# Patient Record
Sex: Male | Born: 1965 | Race: Black or African American | Hispanic: No | Marital: Married | Smoking: Never smoker
Health system: Southern US, Community
[De-identification: ages and names within clinical notes are randomized; demographics above are authoritative.]

## PROBLEM LIST (undated history)

## (undated) DIAGNOSIS — N179 Acute kidney failure, unspecified: Secondary | ICD-10-CM

## (undated) DIAGNOSIS — N184 Chronic kidney disease, stage 4 (severe): Secondary | ICD-10-CM

## (undated) DIAGNOSIS — Q613 Polycystic kidney, unspecified: Secondary | ICD-10-CM

## (undated) HISTORY — PX: OTHER SURGICAL HISTORY: SHX169

---
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
## 1998-06-12 ENCOUNTER — Emergency Department (HOSPITAL_COMMUNITY): Admission: EM | Admit: 1998-06-12 | Discharge: 1998-06-12 | Payer: Self-pay | Admitting: Emergency Medicine

## 1998-07-08 ENCOUNTER — Ambulatory Visit (HOSPITAL_COMMUNITY): Admission: RE | Admit: 1998-07-08 | Discharge: 1998-07-08 | Payer: Self-pay | Admitting: Orthopedic Surgery

## 1998-08-16 ENCOUNTER — Encounter: Payer: Self-pay | Admitting: Orthopedic Surgery

## 1998-08-16 ENCOUNTER — Ambulatory Visit (HOSPITAL_COMMUNITY): Admission: RE | Admit: 1998-08-16 | Discharge: 1998-08-16 | Payer: Self-pay | Admitting: Orthopedic Surgery

## 1998-09-07 ENCOUNTER — Encounter: Admission: RE | Admit: 1998-09-07 | Discharge: 1998-09-26 | Payer: Self-pay | Admitting: Orthopedic Surgery

## 1999-06-30 ENCOUNTER — Emergency Department (HOSPITAL_COMMUNITY): Admission: EM | Admit: 1999-06-30 | Discharge: 1999-06-30 | Payer: Self-pay | Admitting: Emergency Medicine

## 2001-06-18 ENCOUNTER — Encounter: Payer: Self-pay | Admitting: Emergency Medicine

## 2001-06-18 ENCOUNTER — Emergency Department (HOSPITAL_COMMUNITY): Admission: EM | Admit: 2001-06-18 | Discharge: 2001-06-18 | Payer: Self-pay | Admitting: Emergency Medicine

## 2002-07-15 ENCOUNTER — Emergency Department (HOSPITAL_COMMUNITY): Admission: EM | Admit: 2002-07-15 | Discharge: 2002-07-16 | Payer: Self-pay | Admitting: Emergency Medicine

## 2002-07-15 ENCOUNTER — Encounter: Payer: Self-pay | Admitting: Emergency Medicine

## 2006-03-19 ENCOUNTER — Emergency Department (HOSPITAL_COMMUNITY): Admission: EM | Admit: 2006-03-19 | Discharge: 2006-03-19 | Payer: Self-pay | Admitting: Emergency Medicine

## 2008-04-09 ENCOUNTER — Emergency Department (HOSPITAL_COMMUNITY): Admission: EM | Admit: 2008-04-09 | Discharge: 2008-04-09 | Payer: Self-pay | Admitting: Emergency Medicine

## 2016-11-19 DIAGNOSIS — R55 Syncope and collapse: Secondary | ICD-10-CM | POA: Insufficient documentation

## 2016-11-24 DIAGNOSIS — Q613 Polycystic kidney, unspecified: Secondary | ICD-10-CM | POA: Insufficient documentation

## 2019-05-08 ENCOUNTER — Emergency Department (HOSPITAL_COMMUNITY): Payer: Medicaid Other

## 2019-05-08 ENCOUNTER — Inpatient Hospital Stay (HOSPITAL_COMMUNITY)
Admission: EM | Admit: 2019-05-08 | Discharge: 2019-05-15 | DRG: 674 | Disposition: A | Discharge status: Home / Self Care | Payer: Medicaid Other | Attending: Family Medicine | Admitting: Family Medicine

## 2019-05-08 ENCOUNTER — Other Ambulatory Visit: Payer: Self-pay

## 2019-05-08 ENCOUNTER — Inpatient Hospital Stay (HOSPITAL_COMMUNITY): Payer: Medicaid Other

## 2019-05-08 DIAGNOSIS — E8889 Other specified metabolic disorders: Secondary | ICD-10-CM | POA: Diagnosis present

## 2019-05-08 DIAGNOSIS — N185 Chronic kidney disease, stage 5: Secondary | ICD-10-CM

## 2019-05-08 DIAGNOSIS — N2581 Secondary hyperparathyroidism of renal origin: Secondary | ICD-10-CM | POA: Diagnosis present

## 2019-05-08 DIAGNOSIS — N184 Chronic kidney disease, stage 4 (severe): Secondary | ICD-10-CM

## 2019-05-08 DIAGNOSIS — I120 Hypertensive chronic kidney disease with stage 5 chronic kidney disease or end stage renal disease: Secondary | ICD-10-CM | POA: Diagnosis present

## 2019-05-08 DIAGNOSIS — D631 Anemia in chronic kidney disease: Secondary | ICD-10-CM | POA: Diagnosis present

## 2019-05-08 DIAGNOSIS — Z20828 Contact with and (suspected) exposure to other viral communicable diseases: Secondary | ICD-10-CM | POA: Diagnosis present

## 2019-05-08 DIAGNOSIS — Z95828 Presence of other vascular implants and grafts: Secondary | ICD-10-CM

## 2019-05-08 DIAGNOSIS — E860 Dehydration: Secondary | ICD-10-CM | POA: Diagnosis present

## 2019-05-08 DIAGNOSIS — Z79899 Other long term (current) drug therapy: Secondary | ICD-10-CM | POA: Diagnosis not present

## 2019-05-08 DIAGNOSIS — Z841 Family history of disorders of kidney and ureter: Secondary | ICD-10-CM | POA: Diagnosis not present

## 2019-05-08 DIAGNOSIS — Z992 Dependence on renal dialysis: Secondary | ICD-10-CM

## 2019-05-08 DIAGNOSIS — R1110 Vomiting, unspecified: Secondary | ICD-10-CM

## 2019-05-08 DIAGNOSIS — E876 Hypokalemia: Secondary | ICD-10-CM | POA: Diagnosis present

## 2019-05-08 DIAGNOSIS — N179 Acute kidney failure, unspecified: Secondary | ICD-10-CM | POA: Diagnosis present

## 2019-05-08 DIAGNOSIS — N186 End stage renal disease: Secondary | ICD-10-CM | POA: Diagnosis present

## 2019-05-08 DIAGNOSIS — D72829 Elevated white blood cell count, unspecified: Secondary | ICD-10-CM | POA: Diagnosis present

## 2019-05-08 DIAGNOSIS — N19 Unspecified kidney failure: Secondary | ICD-10-CM

## 2019-05-08 DIAGNOSIS — R112 Nausea with vomiting, unspecified: Secondary | ICD-10-CM | POA: Diagnosis present

## 2019-05-08 DIAGNOSIS — D638 Anemia in other chronic diseases classified elsewhere: Secondary | ICD-10-CM

## 2019-05-08 DIAGNOSIS — Z452 Encounter for adjustment and management of vascular access device: Secondary | ICD-10-CM

## 2019-05-08 HISTORY — DX: Chronic kidney disease, stage 4 (severe): N18.4

## 2019-05-08 HISTORY — DX: Acute kidney failure, unspecified: N17.9

## 2019-05-08 HISTORY — DX: Polycystic kidney, unspecified: Q61.3

## 2019-05-08 LAB — CBC
HCT: 28.6 % — ABNORMAL LOW (ref 39.0–52.0)
Hemoglobin: 9.5 g/dL — ABNORMAL LOW (ref 13.0–17.0)
MCH: 30.4 pg (ref 26.0–34.0)
MCHC: 33.2 g/dL (ref 30.0–36.0)
MCV: 91.4 fL (ref 80.0–100.0)
Platelets: 280 10*3/uL (ref 150–400)
RBC: 3.13 MIL/uL — ABNORMAL LOW (ref 4.22–5.81)
RDW: 14.7 % (ref 11.5–15.5)
WBC: 14.5 10*3/uL — ABNORMAL HIGH (ref 4.0–10.5)
nRBC: 0 % (ref 0.0–0.2)

## 2019-05-08 LAB — RENAL FUNCTION PANEL
Albumin: 3.1 g/dL — ABNORMAL LOW (ref 3.5–5.0)
Anion gap: 28 — ABNORMAL HIGH (ref 5–15)
BUN: 168 mg/dL — ABNORMAL HIGH (ref 6–20)
CO2: 9 mmol/L — ABNORMAL LOW (ref 22–32)
Calcium: 4.1 mg/dL — CL (ref 8.9–10.3)
Chloride: 101 mmol/L (ref 98–111)
Creatinine, Ser: 22.63 mg/dL — ABNORMAL HIGH (ref 0.61–1.24)
GFR calc Af Amer: 2 mL/min — ABNORMAL LOW (ref >60)
GFR calc non Af Amer: 2 mL/min — ABNORMAL LOW (ref >60)
Glucose, Bld: 195 mg/dL — ABNORMAL HIGH (ref 70–99)
Phosphorus: >30 mg/dL — ABNORMAL HIGH (ref 2.5–4.6)
Potassium: 2.9 mmol/L — ABNORMAL LOW (ref 3.5–5.1)
Sodium: 138 mmol/L (ref 135–145)

## 2019-05-08 LAB — MRSA PCR SCREENING: MRSA by PCR: NEGATIVE

## 2019-05-08 LAB — COMPREHENSIVE METABOLIC PANEL
ALT: 43 U/L (ref 0–44)
AST: 32 U/L (ref 15–41)
Albumin: 3.4 g/dL — ABNORMAL LOW (ref 3.5–5.0)
Alkaline Phosphatase: 56 U/L (ref 38–126)
Anion gap: 32 — ABNORMAL HIGH (ref 5–15)
BUN: 178 mg/dL — ABNORMAL HIGH (ref 6–20)
CO2: 9 mmol/L — ABNORMAL LOW (ref 22–32)
Calcium: 4.1 mg/dL — CL (ref 8.9–10.3)
Chloride: 98 mmol/L (ref 98–111)
Creatinine, Ser: 25.65 mg/dL — ABNORMAL HIGH (ref 0.61–1.24)
Glucose, Bld: 138 mg/dL — ABNORMAL HIGH (ref 70–99)
Potassium: 3.2 mmol/L — ABNORMAL LOW (ref 3.5–5.1)
Sodium: 139 mmol/L (ref 135–145)
Total Bilirubin: 1 mg/dL (ref 0.3–1.2)
Total Protein: 8.7 g/dL — ABNORMAL HIGH (ref 6.5–8.1)

## 2019-05-08 LAB — URINALYSIS, ROUTINE W REFLEX MICROSCOPIC
Bilirubin Urine: NEGATIVE
Glucose, UA: NEGATIVE mg/dL
Ketones, ur: NEGATIVE mg/dL
Nitrite: NEGATIVE
Protein, ur: 30 mg/dL — AB
Specific Gravity, Urine: 1.011 (ref 1.005–1.030)
pH: 7 (ref 5.0–8.0)

## 2019-05-08 LAB — IRON AND TIBC
Iron: 113 ug/dL (ref 45–182)
Saturation Ratios: 87 % — ABNORMAL HIGH (ref 17.9–39.5)
TIBC: 130 ug/dL — ABNORMAL LOW (ref 250–450)
UIBC: 17 ug/dL

## 2019-05-08 LAB — SARS CORONAVIRUS 2 BY RT PCR (HOSPITAL ORDER, PERFORMED IN ~~LOC~~ HOSPITAL LAB): SARS Coronavirus 2: NEGATIVE

## 2019-05-08 LAB — SARS CORONAVIRUS 2 (TAT 6-24 HRS): SARS Coronavirus 2: NEGATIVE

## 2019-05-08 LAB — FERRITIN: Ferritin: 711 ng/mL — ABNORMAL HIGH (ref 24–336)

## 2019-05-08 LAB — LIPASE, BLOOD: Lipase: 102 U/L — ABNORMAL HIGH (ref 11–51)

## 2019-05-08 LAB — PROTIME-INR
INR: 1.4 — ABNORMAL HIGH (ref 0.8–1.2)
Prothrombin Time: 17.2 seconds — ABNORMAL HIGH (ref 11.4–15.2)

## 2019-05-08 MED ORDER — ONDANSETRON HCL 4 MG/2ML IJ SOLN
4.0000 mg | Freq: Once | INTRAMUSCULAR | Status: AC
  Administered 2019-05-08: 4 mg via INTRAVENOUS
  Filled 2019-05-08: qty 2

## 2019-05-08 MED ORDER — SODIUM CHLORIDE 0.9 % IV SOLN: 100.0000 mL | INTRAVENOUS | Status: DC | PRN

## 2019-05-08 MED ORDER — HEPARIN SODIUM (PORCINE) 1000 UNIT/ML DIALYSIS
1000.0000 [IU] | INTRAMUSCULAR | Status: DC | PRN
  Filled 2019-05-08: qty 1

## 2019-05-08 MED ORDER — LORAZEPAM 2 MG/ML IJ SOLN
0.5000 mg | Freq: Once | INTRAMUSCULAR | Status: AC
  Administered 2019-05-08: 0.5 mg via INTRAVENOUS

## 2019-05-08 MED ORDER — SODIUM CHLORIDE 0.9 % IV BOLUS
1000.0000 mL | Freq: Once | INTRAVENOUS | Status: AC
  Administered 2019-05-08: 1000 mL via INTRAVENOUS

## 2019-05-08 MED ORDER — POLYETHYLENE GLYCOL 3350 17 G PO PACK: 17.0000 g | PACK | Freq: Every day | ORAL | Status: DC | PRN

## 2019-05-08 MED ORDER — HEPARIN SODIUM (PORCINE) 5000 UNIT/ML IJ SOLN
5000.0000 [IU] | Freq: Three times a day (TID) | INTRAMUSCULAR | Status: AC
  Administered 2019-05-09 – 2019-05-10 (×5): 5000 [IU] via SUBCUTANEOUS
  Filled 2019-05-08 (×5): qty 1

## 2019-05-08 MED ORDER — LIDOCAINE HCL (PF) 1 % IJ SOLN
5.0000 mL | INTRAMUSCULAR | Status: DC | PRN
  Filled 2019-05-08: qty 5

## 2019-05-08 MED ORDER — PROCHLORPERAZINE EDISYLATE 10 MG/2ML IJ SOLN
10.0000 mg | Freq: Once | INTRAMUSCULAR | Status: AC
  Administered 2019-05-08: 10 mg via INTRAVENOUS
  Filled 2019-05-08: qty 2

## 2019-05-08 MED ORDER — SODIUM BICARBONATE 8.4 % IV SOLN
INTRAVENOUS | Status: AC
  Administered 2019-05-08: 20:00:00 via INTRAVENOUS
  Filled 2019-05-08 (×2): qty 150

## 2019-05-08 MED ORDER — B-12 250 MCG PO TABS: ORAL_TABLET | Freq: Every day | ORAL | Status: DC

## 2019-05-08 MED ORDER — HEPARIN SODIUM (PORCINE) 1000 UNIT/ML DIALYSIS
1000.0000 [IU] | INTRAMUSCULAR | Status: DC | PRN
  Filled 2019-05-08 (×2): qty 6

## 2019-05-08 MED ORDER — SODIUM CHLORIDE 0.9 % IV SOLN
1.0000 g | Freq: Once | INTRAVENOUS | Status: AC
  Administered 2019-05-08: 1 g via INTRAVENOUS
  Filled 2019-05-08: qty 10

## 2019-05-08 MED ORDER — CALCIUM CARBONATE ANTACID 500 MG PO CHEW
2.0000 | CHEWABLE_TABLET | Freq: Three times a day (TID) | ORAL | Status: DC
  Administered 2019-05-08 – 2019-05-15 (×19): 400 mg via ORAL
  Filled 2019-05-08 (×19): qty 2

## 2019-05-08 MED ORDER — CALCIUM GLUCONATE-NACL 1-0.675 GM/50ML-% IV SOLN
1.0000 g | Freq: Once | INTRAVENOUS | Status: AC
  Administered 2019-05-08: 1000 mg via INTRAVENOUS
  Filled 2019-05-08: qty 50

## 2019-05-08 MED ORDER — ACETAMINOPHEN 650 MG RE SUPP: 650.0000 mg | Freq: Four times a day (QID) | RECTAL | Status: DC | PRN

## 2019-05-08 MED ORDER — ALTEPLASE 2 MG IJ SOLR
2.0000 mg | Freq: Once | INTRAMUSCULAR | Status: DC | PRN
  Filled 2019-05-08: qty 2

## 2019-05-08 MED ORDER — LIDOCAINE-PRILOCAINE 2.5-2.5 % EX CREA: 1.0000 "application " | TOPICAL_CREAM | CUTANEOUS | Status: DC | PRN

## 2019-05-08 MED ORDER — ONDANSETRON HCL 4 MG/2ML IJ SOLN
4.0000 mg | Freq: Once | INTRAMUSCULAR | Status: AC
  Administered 2019-05-08: 4 mg via INTRAVENOUS

## 2019-05-08 MED ORDER — HEPARIN SODIUM (PORCINE) 1000 UNIT/ML IJ SOLN
2.4000 mL | Freq: Once | INTRAMUSCULAR | Status: AC
  Administered 2019-05-09: 2400 [IU] via INTRAVENOUS

## 2019-05-08 MED ORDER — PROCHLORPERAZINE EDISYLATE 10 MG/2ML IJ SOLN
10.0000 mg | INTRAMUSCULAR | Status: DC | PRN
  Filled 2019-05-08: qty 2

## 2019-05-08 MED ORDER — ACETAMINOPHEN 325 MG PO TABS: 650.0000 mg | ORAL_TABLET | Freq: Four times a day (QID) | ORAL | Status: DC | PRN

## 2019-05-08 MED ORDER — POTASSIUM CHLORIDE 10 MEQ/50ML IV SOLN
10.0000 meq | INTRAVENOUS | Status: AC
  Administered 2019-05-08 – 2019-05-09 (×3): 10 meq via INTRAVENOUS
  Filled 2019-05-08 (×3): qty 50

## 2019-05-08 MED ORDER — SODIUM BICARBONATE 8.4 % IV SOLN
50.0000 meq | Freq: Once | INTRAVENOUS | Status: AC
  Administered 2019-05-08: 50 meq via INTRAVENOUS
  Filled 2019-05-08: qty 50

## 2019-05-08 MED ORDER — ONDANSETRON HCL 4 MG/2ML IJ SOLN
INTRAMUSCULAR | Status: AC
  Administered 2019-05-08: 4 mg via INTRAVENOUS
  Filled 2019-05-08: qty 2

## 2019-05-08 MED ORDER — CALCITRIOL 0.25 MCG PO CAPS
1.0000 ug | ORAL_CAPSULE | Freq: Two times a day (BID) | ORAL | Status: DC
  Administered 2019-05-08 – 2019-05-15 (×13): 1 ug via ORAL
  Filled 2019-05-08 (×6): qty 4
  Filled 2019-05-08: qty 2
  Filled 2019-05-08: qty 4
  Filled 2019-05-08: qty 2
  Filled 2019-05-08 (×3): qty 4

## 2019-05-08 MED ORDER — DOCUSATE SODIUM 100 MG PO CAPS
100.0000 mg | ORAL_CAPSULE | Freq: Two times a day (BID) | ORAL | Status: DC
  Administered 2019-05-08 – 2019-05-15 (×13): 100 mg via ORAL
  Filled 2019-05-08 (×13): qty 1

## 2019-05-08 MED ORDER — PENTAFLUOROPROP-TETRAFLUOROETH EX AERO: 1.0000 "application " | INHALATION_SPRAY | CUTANEOUS | Status: DC | PRN

## 2019-05-08 MED ORDER — CHLORHEXIDINE GLUCONATE CLOTH 2 % EX PADS
6.0000 | MEDICATED_PAD | Freq: Every day | CUTANEOUS | Status: DC
  Administered 2019-05-08: 6 via TOPICAL

## 2019-05-08 MED ORDER — LORAZEPAM 2 MG/ML IJ SOLN
INTRAMUSCULAR | Status: AC
  Administered 2019-05-08: 0.5 mg via INTRAVENOUS
  Filled 2019-05-08: qty 1

## 2019-05-08 NOTE — Progress Notes (Signed)
Calcium gluconte requested from pharmacy earlier. At this time pt not on the floor, transported to ICU for procedure. Will administer medication once pt returns to floor.

## 2019-05-08 NOTE — Progress Notes (Signed)
Patient arrived from 5W via bed to 2M09 for placement of a hemodialysis catheter. Dr. Francie Massing at bedside to do procedure. Time out was performed.  CVC was placed in Rt IJ under sterile procedure. Patient tolerated well. Hemodialysis to come to bedside for treatment. Will continue to monitor patient closely.

## 2019-05-08 NOTE — Consult Note (Signed)
Chief Complaint: Patient was seen in consultation today for acute renal failure  Referring Physician(s): Dr. Owens Shark  Supervising Physician: Corrie Mckusick  Patient Status: [facility] - ED  History of Present Illness: Bernard Jordan is a 53 y.o. male with no significant past medical history presents with 7 day history of nausea and vomiting. Suspected to have food poisoning now with dehydration and acute renal injury evidenced by BUN of 178 and SCr of 25.  IR consulted for tunneled dialysis catheter placement.   Patient assessed at bedside.  Adds little to history except to say that "my whole body is not working."  Patient alert and oriented.   No past medical history on file.  Allergies: Patient has no known allergies.  Medications: Prior to Admission medications   Medication Sig Start Date End Date Taking? Authorizing Provider  acetaminophen (TYLENOL) 500 MG tablet Take 1,000 mg by mouth every 6 (six) hours as needed for mild pain or moderate pain.   Yes [provider]  Cyanocobalamin (B-12 PO) Take 1 tablet by mouth daily.   Yes [provider]     No family history on file.  Social History   Socioeconomic History  . Marital status: Married    Spouse name: Not on file  . Number of children: Not on file  . Years of education: Not on file  . Highest education level: Not on file  Occupational History  . Not on file  Social Needs  . Financial resource strain: Not on file  . Food insecurity    Worry: Not on file    Inability: Not on file  . Transportation needs    Medical: Not on file    Non-medical: Not on file  Tobacco Use  . Smoking status: Not on file  Substance and Sexual Activity  . Alcohol use: Not on file  . Drug use: Not on file  . Sexual activity: Not on file  Lifestyle  . Physical activity    Days per week: Not on file    Minutes per session: Not on file  . Stress: Not on file  Relationships  . Social Herbalist on phone:  Not on file    Gets together: Not on file    Attends religious service: Not on file    Active member of club or organization: Not on file    Attends meetings of clubs or organizations: Not on file    Relationship status: Not on file  Other Topics Concern  . Not on file  Social History Narrative  . Not on file     Review of Systems: A 12 point ROS discussed and pertinent positives are indicated in the HPI above.  All other systems are negative.  Review of Systems  Constitutional: Negative for fatigue and fever.  Respiratory: Negative for cough and shortness of breath.   Cardiovascular: Negative for chest pain.  Gastrointestinal: Positive for abdominal pain, nausea and vomiting.  Genitourinary: Negative for dysuria.  Psychiatric/Behavioral: Negative for behavioral problems and confusion.    Vital Signs: BP 133/73   Pulse 85   Temp 97.6 F (36.4 C) (Axillary)   Resp 16   Ht 5\' 11"  (1.803 m)   Wt 220 lb (99.8 kg)   SpO2 96%   BMI 30.68 kg/m   Physical Exam Vitals signs and nursing note reviewed.  Constitutional:      General: He is not in acute distress.    Appearance: Normal appearance. He is  ill-appearing.  HENT:     Mouth/Throat:     Mouth: Mucous membranes are dry.  Cardiovascular:     Rate and Rhythm: Normal rate and regular rhythm.     Pulses: Normal pulses.  Pulmonary:     Effort: Pulmonary effort is normal.     Breath sounds: Normal breath sounds.  Skin:    General: Skin is warm and dry.  Neurological:     General: No focal deficit present.     Mental Status: He is alert and oriented to person, place, and time.  Psychiatric:        Mood and Affect: Mood normal.        Behavior: Behavior normal.        Thought Content: Thought content normal.        Judgment: Judgment normal.      MD Evaluation Airway: WNL Heart: WNL Abdomen: WNL Chest/ Lungs: WNL ASA  Classification: 3 Mallampati/Airway Score: One   Imaging: Dg Abd Acute 2+v W 1v Chest   Result Date: 05/08/2019 CLINICAL DATA:  Abdominal pain.  Intermittent nausea and vomiting. EXAM: DG ABDOMEN ACUTE W/ 1V CHEST COMPARISON:  No recent prior. FINDINGS: No acute cardiopulmonary disease. No bowel distention or free air thoracolumbar spine scoliosis. No acute bony abnormality. Pelvic calcifications consistent phleboliths. IMPRESSION: 1.  No acute cardiopulmonary disease. 2.  No acute intra-abdominal abnormality. Electronically Signed   By: Marcello Moores  Register   On: 05/08/2019 15:56    Labs:  CBC: Recent Labs    05/08/19 1320  WBC 14.5*  HGB 9.5*  HCT 28.6*  PLT 280    COAGS: No results for input(s): INR, APTT in the last 8760 hours.  BMP: Recent Labs    05/08/19 1320  NA 139  K 3.2*  CL 98  CO2 9*  GLUCOSE 138*  BUN 178*  CALCIUM 4.1*  CREATININE 25.65*  GFRNONAA NOT CALCULATED  GFRAA NOT CALCULATED    LIVER FUNCTION TESTS: Recent Labs    05/08/19 1320  BILITOT 1.0  AST 32  ALT 43  ALKPHOS 56  PROT 8.7*  ALBUMIN 3.4*    TUMOR MARKERS: No results for input(s): AFPTM, CEA, CA199, CHROMGRNA in the last 8760 hours.  Assessment and Plan: Acute renal failure Patient presented to ED with dehydration and renal failure.  He is in need of dialysis initiation.   IR consulted for tunneled catheter placement at the request of Dr. Owens Shark.  Dr. Earleen Newport has discussed with Dr. Owens Shark.  IR unable to accomodate for tunneled catheter placement today.  Plan made for tunneled dialysis catheter placement tomorrow.  If patient in need of urgent dialysis, consider bedside placement of temporary catheter placement by ED vs. CCM.  NPO p MN.  INR pending.   Thank you for this interesting consult.  I greatly enjoyed meeting Bernard Jordan and look forward to participating in their care.  A copy of this report was sent to the requesting provider on this date.  Electronically Signed: Docia Barrier, PA 05/08/2019, 4:53 PM   I spent a total of 40 Minutes    in face to  face in clinical consultation, greater than 50% of which was counseling/coordinating care for acute renal failure.

## 2019-05-08 NOTE — Progress Notes (Signed)
Bernard Jordan [id number] Admission Data: 05/08/2019 7:00 PM Attending Provider: Albertine Patricia, MD  [id number], No Pcp Per Consults/ Treatment Team: Treatment Team:  Corliss Parish, MD  Bernard Jordan is a 53 y.o. male patient admitted from ED awake, alert  & orientated  X 3,  Full Code, VSS - Blood pressure 124/69, pulse (!) 101, temperature 97.7 F (36.5 C), temperature source Oral, resp. rate 18, height 5\' 11"  (1.803 m), weight 99.8 kg, SpO2 98 %., no c/o shortness of breath, no c/o chest pain, no distress noted. Tele # 35 placed and pt is currently running:normal sinus rhythm.   IV site WDL:  antecubital left, condition patent and no redness with a transparent dsg that's clean dry and intact.  Allergies:  No Known Allergies   No past medical history on file.  History:  obtained from chart review. Tobacco/alcohol: denied none  Pt orientation to unit, room and routine. Information packet given to patient/family and safety video watched.  Admission INP armband ID verified with patient/family, and in place. SR up x 2, fall risk assessment complete with Patient and family verbalizing understanding of risks associated with falls. Pt verbalizes an understanding of how to use the call bell and to call for help before getting out of bed.  Skin, clean-dry- intact without evidence of bruising, or skin tears.   No evidence of skin break down noted on exam. no rashes, no ecchymoses, no petechiae, no nodules, no jaundice, no purpura, no wounds. Minor scarring on bottom.     Will cont to monitor and assist as needed.  Salley Slaughter, RN 05/08/2019 7:00 PM

## 2019-05-08 NOTE — ED Triage Notes (Signed)
7 day hx of weakness post eating crab legs and steak.  Raised rash appeared and has N/V with progressive weakness.  Cough also noted, no diarrhea or noted pain.

## 2019-05-08 NOTE — ED Notes (Signed)
On arrival pt having more spit into emesis basin than vomit.

## 2019-05-08 NOTE — Consult Note (Signed)
 [facility] Renal Consultation Note    Indication for Consultation:  Management of ESRD/hemodialysis; anemia, hypertension/volume and secondary hyperparathyroidism PCP:  HPI: Bernard Jordan is a 53 y.o. male is a 53 Y/O AA male who presented to ED this afternoon with complaints of nausea and vomiting. PMH of polycystic kidney disease.He has not been followed by nephrology. He was seen by Dr. Christella Hartigan 11/22/2016 for CKD stage 4 D/T PCKD and hypocalcemia at [facility] during an admission for syncope and was to follow up with her the following week. Unclear if he actually followed through with appointment. His mother is currently on HD. He has six siblings none of whom have kidney disease that he knows about now. 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   Patient presents with nausea and vomiting, says he ate seafood and steak about a week and has been nauseated since, unable to eat or drink without nausea. He also endorses itching, lethargy, dysgeusia and in general feeling awful. He says symptoms have waxed and waned in the past but now nothing seems to alleviate his symptoms. He is alert, oriented to person and place but needed prompting for time. He follows commands and is answering most questions appropriately. Last renal US done 06/19/2017 showed R Kidney 17.2 CM with irregular multi-cystic appearance. Echogenicity WNL. No mass or hydronephrosis. L Kidney 16.2 with multi-cystic appearance. No mass or hydronephrosis. Last Scr 6.7 BUN 52 EGFR 11 Ca 6.9 12/16/2016/   Upon arrival to ED, he was noted to have SCr 25.6 BUN 178, Co2 9 Ca 4.1 C Ca 4.58  WBC 14.5 HGB 9.5 HCT 28.6 PLT 280. Abdominal 2-view negative for acute changes, lipase 102. Discussed with patient the need to start hemodialysis and he agrees. Will make arrangements for placement of temporary HD catheter and start HD tonight. Patient verbalizes understanding-has  Mother who is currently on hemodialysis.     No family history on file. Social History:  has no history on file for tobacco, alcohol, and drug. No Known Allergies Prior to Admission medications   Medication Sig Start Date End Date Taking? Authorizing Provider  acetaminophen (TYLENOL) 500 MG tablet Take 1,000 mg by mouth every 6 (six) hours as needed for mild pain or moderate pain.   Yes [provider]  Cyanocobalamin (B-12 PO) Take 1 tablet by mouth daily.   Yes [provider]   Current Facility-Administered Medications  Medication Dose Route Frequency Provider Last Rate Last Dose  . calcium gluconate 1 g in sodium chloride 0.9 % 100 mL IVPB  1 g Intravenous Once Maudie Flakes, MD 110 mL/hr at 05/08/19 1614 1 g at 05/08/19 1614  . prochlorperazine (COMPAZINE) injection 10 mg  10 mg Intravenous Once Maudie Flakes, MD       Current Outpatient Medications  Medication Sig Dispense Refill  . acetaminophen (TYLENOL) 500 MG tablet Take 1,000 mg by mouth every 6 (six) hours as needed for mild pain or moderate pain.    . Cyanocobalamin (B-12 PO) Take 1 tablet by mouth daily.     Labs: Basic Metabolic Panel: Recent Labs  Lab 05/08/19 1320  NA 139  K 3.2*  CL 98  CO2 9*  GLUCOSE 138*  BUN 178*  CREATININE 25.65*  CALCIUM 4.1*   Liver Function Tests: Recent Labs  Lab 05/08/19 1320  AST 32  ALT 43  ALKPHOS 56  BILITOT 1.0  PROT 8.7*  ALBUMIN 3.4*   Recent Labs  Lab 05/08/19 1320  LIPASE 102*   No results for input(s): AMMONIA in the last 168 hours. CBC: Recent Labs  Lab 05/08/19 1320  WBC 14.5*  HGB 9.5*  HCT 28.6*  MCV 91.4  PLT 280   Cardiac Enzymes: No results for input(s): CKTOTAL, CKMB, CKMBINDEX, TROPONINI in the last 168 hours. CBG: No results for input(s): GLUCAP in the last 168 hours. Iron Studies: No results for input(s): IRON, TIBC, TRANSFERRIN, FERRITIN in the last 72 hours. Studies/Results: Dg Abd Acute 2+v W 1v  Chest  Result Date: 05/08/2019 CLINICAL DATA:  Abdominal pain.  Intermittent nausea and vomiting. EXAM: DG ABDOMEN ACUTE W/ 1V CHEST COMPARISON:  No recent prior. FINDINGS: No acute cardiopulmonary disease. No bowel distention or free air thoracolumbar spine scoliosis. No acute bony abnormality. Pelvic calcifications consistent phleboliths. IMPRESSION: 1.  No acute cardiopulmonary disease. 2.  No acute intra-abdominal abnormality. Electronically Signed   By: Marcello Moores  Register   On: 05/08/2019 15:56    ROS: As per HPI otherwise negative.   Physical Exam: Vitals:   05/08/19 1205 05/08/19 1209 05/08/19 1330 05/08/19 1500  BP:  102/67 117/89 123/83  Pulse:  100 98 90  Resp:  17 18 17   Temp:  97.6 F (36.4 C)    TempSrc:  Axillary    SpO2:  98% 98% 95%  Weight: 99.8 kg     Height: 5' 11"  (1.803 m)        General: Chronically ill appearing male in no acute distress. Head: Normocephalic, atraumatic, sclera non-icteric, mucus membranes are moist Neck: Supple. JVD not elevated. Lungs: Clear bilaterally to auscultation without wheezes, rales, or rhonchi. Breathing is unlabored. Heart: RRR with S1 S2. No murmurs, rubs, or gallops appreciated. Abdomen: Soft, non-tender, non-distended with normoactive bowel sounds. No rebound/guarding. No obvious abdominal masses. M-S:  Strength and tone appear normal for age. Lower extremities:without edema or ischemic changes, no open wounds  Neuro: Alert and oriented X 3. Moves all extremities spontaneously. Psych:  Responds to questions appropriately with a normal affect.   Assessment/Plan: 1. ESRD-present with SCr 25.6 BUN 178 EGFR unable to be calculated. Overtly uremic, agrees to start HD tonight. PCCM has been ask to place temporary HD cath tonight.  2. Hypokalemia-use 4.0 K bath with HD. Follow RFP 3. Hypocalcemia-Ca 4.1 C Ca 4.5. Fortunately asymptomatic. Replete calcium with IV supplements.  4. PCKD-apparently has not followed up with nephrology or  received treatment. Will order renal US to assess kidney size and R/O hydronephrosis.  5.  Hypertension/volume  - Normotensive without evidence of volume overload. Run even.  6.  Anemia  - HGB 9.5 Will draw Fe panel with HD tonight. Start ESA.  7.  Metabolic bone disease -  Check PTH, phos. Replete calcium as noted above 8.  Nutrition - NPO at present. Albumin 3.4. Renal diet with renal vits/prostat when able to eat.    H. Owens Shark, NP-C 05/08/2019, 4:30 PM  [place]

## 2019-05-08 NOTE — ED Provider Notes (Signed)
[facility] Emergency Department Provider Note MRN:  [id number]  Arrival date & time: 05/08/19     Chief Complaint   Vomiting History of Present Illness   Bernard Jordan is a 53 y.o. year-old male with no pertinent past medical history presenting to the ED with chief complaint of vomiting.  Patient explains that he ate some crab legs about 1 week ago and ever since then has been having persistent nausea, vomiting, unable to eat or drink much, denies chest pain or shortness of breath, no headache, no abdominal pain, no diarrhea.  Feels general malaise, feels dehydrated, symptoms are moderate, constant, no exacerbating relieving factors.  Review of Systems  A complete 10 system review of systems was obtained and all systems are negative except as noted in the HPI and PMH.   Patient's Health History   Past medical history: Polycystic kidney disease Social history: Denies illicit drug use No family history on file.  Social History   Socioeconomic History  . Marital status: Married    Spouse name: Not on file  . Number of children: Not on file  . Years of education: Not on file  . Highest education level: Not on file  Occupational History  . Not on file  Social Needs  . Financial resource strain: Not on file  . Food insecurity    Worry: Not on file    Inability: Not on file  . Transportation needs    Medical: Not on file    Non-medical: Not on file  Tobacco Use  . Smoking status: Not on file  Substance and Sexual Activity  . Alcohol use: Not on file  . Drug use: Not on file  . Sexual activity: Not on file  Lifestyle  . Physical activity    Days per week: Not on file    Minutes per session: Not on file  . Stress: Not on file  Relationships  . Social Herbalist on phone: Not on file    Gets together: Not on file    Attends religious service: Not on file    Active member of club or organization: Not on file    Attends meetings of clubs or  organizations: Not on file    Relationship status: Not on file  . Intimate partner violence    Fear of current or ex partner: Not on file    Emotionally abused: Not on file    Physically abused: Not on file    Forced sexual activity: Not on file  Other Topics Concern  . Not on file  Social History Narrative  . Not on file     Physical Exam  Vital Signs and Nursing Notes reviewed Vitals:   05/08/19 1209 05/08/19 1330  BP: 102/67 117/89  Pulse: 100 98  Resp: 17 18  Temp: 97.6 F (36.4 C)   SpO2: 98% 98%    CONSTITUTIONAL: Chronically ill-appearing, NAD NEURO:  Alert and oriented x 3, no focal deficits EYES:  eyes equal and reactive ENT/NECK:  no LAD, no JVD CARDIO: Regular rate, well-perfused, normal S1 and S2 PULM:  CTAB no wheezing or rhonchi GI/GU:  normal bowel sounds, non-distended, non-tender MSK/SPINE:  No gross deformities, no edema SKIN:  no rash, atraumatic PSYCH:  Appropriate speech and behavior  Diagnostic and Interventional Summary    EKG Interpretation  Date/Time:  Friday May 08 2019 12:07:45 EDT Ventricular Rate:  102 PR Interval:    QRS Duration: 100 QT Interval:  426 QTC Calculation: 555 R Axis:   -5 Text Interpretation:  Age not entered, assumed to be  53 years old for purpose of ECG interpretation Sinus tachycardia Abnormal R-wave progression, late transition Prolonged QT interval Confirmed by Gerlene Fee [phone]) on 05/08/2019 1:31:40 PM      Labs Reviewed  CBC - Abnormal; Notable for the following components:      Result Value   WBC 14.5 (*)    RBC 3.13 (*)    Hemoglobin 9.5 (*)    HCT 28.6 (*)    All other components within normal limits  COMPREHENSIVE METABOLIC PANEL - Abnormal; Notable for the following components:   Potassium 3.2 (*)    CO2 9 (*)    Glucose, Bld 138 (*)    BUN 178 (*)    Creatinine, Ser 25.65 (*)    Calcium 4.1 (*)    Total Protein 8.7 (*)    Albumin 3.4 (*)    Anion gap 32 (*)    All other components within  normal limits  LIPASE, BLOOD - Abnormal; Notable for the following components:   Lipase 102 (*)    All other components within normal limits  SARS CORONAVIRUS 2  URINALYSIS, ROUTINE W REFLEX MICROSCOPIC    DG ABD ACUTE 2+V W 1V CHEST    (Results Pending)    Medications  calcium gluconate 1 g in sodium chloride 0.9 % 100 mL IVPB (has no administration in time range)  sodium bicarbonate injection 50 mEq (has no administration in time range)  sodium chloride 0.9 % bolus 1,000 mL (1,000 mLs Intravenous New Bag/Given 05/08/19 1325)  sodium chloride 0.9 % bolus 1,000 mL (1,000 mLs Intravenous New Bag/Given 05/08/19 1324)  ondansetron (ZOFRAN) injection 4 mg (4 mg Intravenous Given 05/08/19 1324)     Procedures Critical Care Critical Care Documentation Critical care time provided by me (excluding procedures): 35 minutes  Condition necessitating critical care: Acute renal failure  Components of critical care management: reviewing of prior records, laboratory and imaging interpretation, frequent re-examination and reassessment of vital signs, administration of IV bicarbonate, IV fluids, IV calcium, discussion with consulting services.    ED Course and Medical Decision Making  I have reviewed the triage vital signs and the nursing notes.  Pertinent labs & imaging results that were available during my care of the patient were reviewed by me and considered in my medical decision making (see below for details).  Suspect foodborne illness in this 53 year old male, soft abdomen, normal neurological exam, will plain film to exclude obvious obstructive process, labs to evaluate for AKI, provide symptomatic management and reassess.  Labs reveal profound AKI with a creatinine of 25, discussed with hospitalist who reveals the patient has a history of polycystic kidney disease with a baseline creatinine of 6.  Discussed with nephrologist on-call who will provide recommendations.  Awaiting bladder scan  results, to be admitted to hospital service for further care, possibly stepdown unit given patient's acidosis related to kidney failure.  Barth Kirks. Sedonia Small, [place] [email or web address]  Final Clinical Impressions(s) / ED Diagnoses     ICD-10-CM   1. Acute renal failure, unspecified acute renal failure type ([place])  N17.9   2. Emesis  R11.10 DG ABD ACUTE 2+V W 1V CHEST    DG ABD ACUTE 2+V W 1V CHEST    ED Discharge Orders    None         Maudie Flakes, MD 05/08/19 [phone]

## 2019-05-08 NOTE — H&P (Signed)
TRH H&P   Patient Demographics:    Bernard Jordan, is a 53 y.o. male  MRN: [id number]   DOB - May 17, 1966  Admit Date - 05/08/2019  Outpatient Primary MD for the patient is Patient, No Pcp Per  Referring MD/NP/PA: Dr Sedonia Small  Patient coming from: Home  No chief complaint on file.     HPI:    Bernard Jordan  is a 53 y.o. male, with past medical history of polycystic kidney disease, chronic kidney disease most recent creatinine was around 6 range before 2 years, but apparently patient does not follow with any physicians. -Patient presents to ED secondary to complaints of nausea or vomiting, reports he had a seafood and steak about a week ago, has been feeling nauseated since, with poor oral intake, unable to eat or drink without nausea, as well he does report itching, lethargy, and not feeling well, he denies chest pain, shortness of breath, fever, chills, reports he still making urine, denies dysuria or polyuria. - in ED patient noted to have creatinine of 25, BUN of 78, bicarb of 9, calcium of 4.9, mild leukocytosis at 14.5, lipase of 102, I was requested to admit, renal were consulted as well.    Review of systems:    In addition to the HPI above, No Fever-chills, reports generalized weakness and fatigue No Headache, No changes with Vision or hearing, No problems swallowing food or Liquids, No Chest pain, Cough or Shortness of Breath, No Abdominal pain, complains of nausea and vomiting bowel movements are regular, No Blood in stool or Urine, No dysuria, No new skin rashes or bruises, No new joints pains-aches,  No new weakness, tingling, numbness in any extremity, No recent weight gain or loss, No polyuria, polydypsia or polyphagia, No significant Mental Stressors.  A full 10 point Review of Systems was done, except as stated above, all other Review of Systems were negative.    With Past History of the following :    B12 deficiency Polycystic kidney disease Chronic kidney disease   Social History:     Patient denies history of tobacco or alcohol abuse    Family History :   Reports history of mother who is currently on hemodialysis, has other 6 siblings with no kidney disease   Home Medications:   Prior to Admission medications   Medication Sig Start Date End Date Taking? Authorizing Provider  acetaminophen (TYLENOL) 500 MG tablet Take 1,000 mg by mouth every 6 (six) hours as needed for mild pain or moderate pain.   Yes [provider]  Cyanocobalamin (B-12 PO) Take 1 tablet by mouth daily.   Yes [provider]     Allergies:    No Known Allergies   Physical Exam:   Vitals  Blood pressure 133/73, pulse 85, temperature 97.6 F (36.4 C), temperature source Axillary, resp. rate 16, height 5\' 11"  (1.803 m), weight 99.8 kg,  SpO2 96 %.   1. General ill appearing male, laying in bed in no apparent distress  2. Normal affect and insight, Not Suicidal or Homicidal, Awake Alert, Oriented X 3.  3. No F.N deficits, ALL C.Nerves Intact, Strength 5/5 all 4 extremities, Sensation intact all 4 extremities, Plantars down going.  4. Ears and Eyes appear Normal, Conjunctivae clear, PERRLA. Moist Oral Mucosa.  5. Supple Neck, No JVD, No cervical lymphadenopathy appriciated, No Carotid Bruits.  6. Symmetrical Chest wall movement, Good air movement bilaterally, CTAB.  7. RRR, No Gallops, Rubs or Murmurs, No Parasternal Heave.  8. Positive Bowel Sounds, Abdomen Soft, No tenderness, No organomegaly appriciated,No rebound -guarding or rigidity.  9.  No Cyanosis, Normal Skin Turgor, No Skin Rash or Bruise.  10. Good muscle tone,  joints appear normal , no effusions, Normal ROM.  11. No Palpable Lymph Nodes in Neck or Axillae     Data Review:    CBC Recent Labs  Lab 05/08/19 1320  WBC 14.5*  HGB 9.5*  HCT 28.6*  PLT 280  MCV  91.4  MCH 30.4  MCHC 33.2  RDW 14.7   ------------------------------------------------------------------------------------------------------------------  Chemistries  Recent Labs  Lab 05/08/19 1320  NA 139  K 3.2*  CL 98  CO2 9*  GLUCOSE 138*  BUN 178*  CREATININE 25.65*  CALCIUM 4.1*  AST 32  ALT 43  ALKPHOS 56  BILITOT 1.0   ------------------------------------------------------------------------------------------------------------------ estimated creatinine clearance is 4 mL/min (A) (by C-G formula based on SCr of 25.65 mg/dL (H)). ------------------------------------------------------------------------------------------------------------------ No results for input(s): TSH, T4TOTAL, T3FREE, THYROIDAB in the last 72 hours.  Invalid input(s): FREET3  Coagulation profile No results for input(s): INR, PROTIME in the last 168 hours. ------------------------------------------------------------------------------------------------------------------- No results for input(s): DDIMER in the last 72 hours. -------------------------------------------------------------------------------------------------------------------  Cardiac Enzymes No results for input(s): CKMB, TROPONINI, MYOGLOBIN in the last 168 hours.  Invalid input(s): CK ------------------------------------------------------------------------------------------------------------------ No results found for: BNP   ---------------------------------------------------------------------------------------------------------------  Urinalysis No results found for: COLORURINE, APPEARANCEUR, LABSPEC, PHURINE, GLUCOSEU, HGBUR, BILIRUBINUR, KETONESUR, PROTEINUR, UROBILINOGEN, NITRITE, LEUKOCYTESUR  ----------------------------------------------------------------------------------------------------------------   Imaging Results:    Dg Abd Acute 2+v W 1v Chest  Result Date: 05/08/2019 CLINICAL DATA:  Abdominal pain.   Intermittent nausea and vomiting. EXAM: DG ABDOMEN ACUTE W/ 1V CHEST COMPARISON:  No recent prior. FINDINGS: No acute cardiopulmonary disease. No bowel distention or free air thoracolumbar spine scoliosis. No acute bony abnormality. Pelvic calcifications consistent phleboliths. IMPRESSION: 1.  No acute cardiopulmonary disease. 2.  No acute intra-abdominal abnormality. 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  Hypocalcemia -Repleted with IV supplement.  History of poly-cystic kidney disease -Patient did not follow with nephrology as an outpatient, renal ultrasound pending  Anemia -Check anemia panel, will be started on Neupogen per  renal  Prolonged QTC -We will monitor on telemetry, correct hypocalcemia and repeat EKG in a.m.  DVT Prophylaxis Heparin - SCDs   AM Labs Ordered, also please review Full Orders  Family Communication: Admission, patients condition and plan of care including tests being ordered have been discussed with the patient (tried to call contact on the records with no success) who indicate understanding and agree with the plan and Code Status.  Code Status full  Likely DC to  Home  Condition GUARDED    Consults called: Renal  Admission status: inpatient  Time spent in  minutes : 55 minutes   Phillips Climes M.D on 05/08/2019 at 5:20 PM  Between 7am to 7pm - Pager - [phone]. After 7pm go to [email or web address] - password [facility]  [facility] - Office  [phone]

## 2019-05-08 NOTE — Procedures (Signed)
Hemodialysis Catheter Insertion Procedure Note HARROLD ZINGER [id number] January 06, 1966  Procedure: Insertion of Hemodialysis Catheter Indications: Dialysis Access   Procedure Details Consent: Risks of procedure as well as the alternatives and risks of each were explained to the (patient/caregiver).  Consent for procedure obtained. Time Out: Verified patient identification, verified procedure, site/side was marked, verified correct patient position, special equipment/implants available, medications/allergies/relevent history reviewed, required imaging and test results available.  Performed  Maximum sterile technique was used including antiseptics, cap, gloves, gown, hand hygiene, mask and sheet. Skin prep: Chlorhexidine; local anesthetic administered Triple lumen hemodialysis catheter was inserted into right internal jugular vein using the Seldinger technique.  Evaluation Blood flow good Complications: No apparent complications Patient did tolerate procedure well. Chest X-ray ordered to verify placement.  CXR: pending.   Jacalyn Lefevre 05/08/2019

## 2019-05-08 NOTE — ED Notes (Signed)
MD aware of 4.1 calcium and kidney functions ; no further orders received

## 2019-05-08 NOTE — Progress Notes (Signed)
Report taken from [place]. Waiting on patient arrival.

## 2019-05-09 ENCOUNTER — Encounter (HOSPITAL_COMMUNITY): Payer: Self-pay

## 2019-05-09 DIAGNOSIS — N170 Acute kidney failure with tubular necrosis: Secondary | ICD-10-CM

## 2019-05-09 LAB — CBC
HCT: 23.2 % — ABNORMAL LOW (ref 39.0–52.0)
Hemoglobin: 8 g/dL — ABNORMAL LOW (ref 13.0–17.0)
MCH: 30.4 pg (ref 26.0–34.0)
MCHC: 34.5 g/dL (ref 30.0–36.0)
MCV: 88.2 fL (ref 80.0–100.0)
Platelets: 217 10*3/uL (ref 150–400)
RBC: 2.63 MIL/uL — ABNORMAL LOW (ref 4.22–5.81)
RDW: 14 % (ref 11.5–15.5)
WBC: 13.6 10*3/uL — ABNORMAL HIGH (ref 4.0–10.5)
nRBC: 0 % (ref 0.0–0.2)

## 2019-05-09 LAB — HIV ANTIBODY (ROUTINE TESTING W REFLEX): HIV Screen 4th Generation wRfx: NONREACTIVE

## 2019-05-09 MED ORDER — POTASSIUM CHLORIDE 10 MEQ/50ML IV SOLN
10.0000 meq | INTRAVENOUS | Status: AC
  Administered 2019-05-09: 100 meq via INTRAVENOUS
  Administered 2019-05-09 (×2): 10 meq via INTRAVENOUS
  Filled 2019-05-09 (×3): qty 50

## 2019-05-09 MED ORDER — SODIUM CHLORIDE 0.9 % IV SOLN
INTRAVENOUS | Status: AC
  Administered 2019-05-09: 12:00:00 via INTRAVENOUS

## 2019-05-09 MED ORDER — POTASSIUM CHLORIDE CRYS ER 20 MEQ PO TBCR
20.0000 meq | EXTENDED_RELEASE_TABLET | Freq: Two times a day (BID) | ORAL | Status: AC
  Administered 2019-05-09 – 2019-05-10 (×4): 20 meq via ORAL
  Filled 2019-05-09 (×4): qty 1

## 2019-05-09 MED ORDER — PNEUMOCOCCAL VAC POLYVALENT 25 MCG/0.5ML IJ INJ: 0.5000 mL | INJECTION | INTRAMUSCULAR | Status: DC | PRN

## 2019-05-09 MED ORDER — SODIUM CHLORIDE 0.9% FLUSH
10.0000 mL | INTRAVENOUS | Status: DC | PRN
  Administered 2019-05-12 – 2019-05-13 (×2): 10 mL
  Filled 2019-05-09 (×2): qty 40

## 2019-05-09 MED ORDER — DARBEPOETIN ALFA 25 MCG/0.42ML IJ SOSY
25.0000 ug | PREFILLED_SYRINGE | INTRAMUSCULAR | Status: DC
  Filled 2019-05-09: qty 0.42

## 2019-05-09 MED ORDER — NEPRO/CARBSTEADY PO LIQD
237.0000 mL | Freq: Two times a day (BID) | ORAL | Status: DC
  Administered 2019-05-10 – 2019-05-15 (×8): 237 mL via ORAL

## 2019-05-09 MED ORDER — CALCIUM GLUCONATE-NACL 1-0.675 GM/50ML-% IV SOLN
1.0000 g | Freq: Once | INTRAVENOUS | Status: AC
  Administered 2019-05-09: 1000 mg via INTRAVENOUS
  Filled 2019-05-09: qty 50

## 2019-05-09 MED ORDER — DARBEPOETIN ALFA 100 MCG/0.5ML IJ SOSY
100.0000 ug | PREFILLED_SYRINGE | INTRAMUSCULAR | Status: DC
  Filled 2019-05-09: qty 0.5

## 2019-05-09 MED ORDER — CHLORHEXIDINE GLUCONATE CLOTH 2 % EX PADS
6.0000 | MEDICATED_PAD | Freq: Every day | CUTANEOUS | Status: DC
  Administered 2019-05-09 – 2019-05-15 (×7): 6 via TOPICAL

## 2019-05-09 MED ORDER — RENA-VITE PO TABS
1.0000 | ORAL_TABLET | Freq: Every day | ORAL | Status: DC
  Administered 2019-05-09 – 2019-05-14 (×6): 1 via ORAL
  Filled 2019-05-09 (×6): qty 1

## 2019-05-09 NOTE — Progress Notes (Signed)
Initial Nutrition Assessment  DOCUMENTATION CODES:   Obesity unspecified  INTERVENTION:  -Inappropriate for new HD education at this time -Nepro Shake po BID, each supplement provides 425 kcal and 19 grams protein -Rena-vit po daily   NUTRITION DIAGNOSIS:   Increased nutrient needs related to chronic illness(ESRD on HD) as evidenced by estimated needs.   GOAL:   Patient will meet greater than or equal to 90% of their needs   MONITOR:   PO intake, Labs, Supplement acceptance, Weight trends, I & O's  REASON FOR ASSESSMENT:   Malnutrition Screening Tool    ASSESSMENT:  RD working remotely.  53 year old male with past medical history of polycystic kidney disease, CKD; pt does not follow with any physicians, who presented to ED with complaints of n/v after eating seafood and steak about a week ago. Patient has been feeling nauseated since, with poor po; unable to drink or eat, reports itching, lethargy, and generally not  feeling well. In ED pt noted to have creatinine of 25, BUN of 78, Ca 4.9; pt admitted and started on HD with right IJ catheter.  RD unable to reach patient via phone today. PTA most recent creatinine was around 6; taken 2 years ago. 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 Nursing reports patient resting comfortably s/p HD today  8/10 - tentatively IR tunneled HD catheter placement  Patient documented with 100% breakfast  Current wt -  99.8kg (219.6 lb) no edema No wt history available   Medications reviewed and include: calcitriol, tums, colace, potasium chloride CR 20 mEq tablet  Labs: K - 2.3 Cr 15.06 - trending down BUN 108 - trending down Corrected Ca 6.08 - trending up  NUTRITION - FOCUSED PHYSICAL EXAM: Unable to complete at this time   Diet Order:   Diet Order            Diet NPO time specified Except for: Sips with Meds  Diet effective midnight        Diet renal/carb modified with  fluid restriction Diet-HS Snack? Nothing; Fluid restriction: 1200 mL Fluid; Room service appropriate? Yes; Fluid consistency: Thin  Diet effective now              EDUCATION NEEDS:   Not appropriate for education at this time  Skin:  Skin Assessment: Reviewed RN Assessment  Last BM:  PTA  Height:   Ht Readings from Last 1 Encounters:  05/08/19 5\' 11"  (1.803 m)    Weight:   Wt Readings from Last 1 Encounters:  05/08/19 99.8 kg    Ideal Body Weight:  70.5 kg  BMI:  Body mass index is 30.68 kg/m.  Estimated Nutritional Needs: Based on ABW 95.1 kg  Kcal:  2800-3000  Protein:  140-150  Fluid:  1217ml per MD   Lajuan Lines, RD, LDN  After Hours/Weekend Pager: [phone]

## 2019-05-09 NOTE — Progress Notes (Signed)
IR.  Patient with history of ESRD in need of HD, s/p non-tunneled HD placement in ED 05/08/2019. Request has been made for tunneled HD placement.   Plan for image-guided tunneled HD catheter placement in IR tentatively for Monday 05/11/2019. Patient will be NPO at midnight prior to procedure (NPO 05/11/2019 at 0001). Hold all thinners at midnight prior to procedure (starting 05/10/2019 at 2359). INR 1.4 yesterday. Patient has been seen/consented for procedure (see consult note). Earlie Server, RN aware.  Please call IR with questions/concerns.   Bea Graff Dovber Ernest, PA-C 05/09/2019, 10:12 AM

## 2019-05-09 NOTE — Progress Notes (Signed)
Report called to 5W nurse. Dialysis just finished. VSS. Patient resting in bed comfortably.

## 2019-05-09 NOTE — Progress Notes (Signed)
Per monitor tech patient had multiple runs of Vtach. Patient asymptomatic. Provider paged and made aware. New orders placed for labs.

## 2019-05-09 NOTE — Progress Notes (Signed)
PROGRESS NOTE    Bernard Jordan  [id number] DOB: [birth year] DOA: 05/08/2019 PCP: Patient, No Pcp Per    Brief Narrative:  Bernard Jordan  is a 53 y.o. male, with past medical history of polycystic kidney disease, chronic kidney disease most recent creatinine was around 6 range 2 years ago but apparently patient does not follow with any physicians. Patient presents to ED secondary to complaints of nausea and vomiting, reports he had a seafood and steak about a week ago, has been feeling nauseated since, with poor oral intake, unable to eat or drink without nausea, as well he does report itching, lethargy, and not feeling well, he denies chest pain, shortness of breath, fever, chills, reports he still making urine, denies dysuria or polyuria. - in ED patient noted to have creatinine of 25, BUN of 78, bicarb of 9, calcium of 4.9, mild leukocytosis at 14.5, lipase of 102 Admitted and started on HD with right IJ catheter.  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 Admitted and received a temporary catheter and underwent hemodialysis last night.  Clinically improving.  Seen and followed by nephrology.  Patient will have subsequent dialysis. IR consulted for tunnel catheter, Monday morning. Patient will probably end up needing permanent hemodialysis.  Hypocalcemia: Replaced, monitor levels.  Hypokalemia: Severe and persistent.  Replace and monitor.  Will check magnesium with morning levels.   DVT prophylaxis: Heparin subcu Code Status: Full code Family Communication: None Disposition Plan: Progressive   Consultants:   Nephrology  Procedures:   Hemodialysis  Antimicrobials:   None   Subjective: Patient seen and examined.  No  overnight events.  No nausea or vomiting.  He does not feel any shortness of breath.  Has not ambulated yet.  Objective: Vitals:   05/09/19 0325 05/09/19 0609 05/09/19 0614 05/09/19 0900  BP:   107/61 113/77  Pulse:  (!) 108    Resp: 19  19 (!) 24  Temp:  98.3 F (36.8 C) 98.2 F (36.8 C)   TempSrc:  Oral Oral   SpO2:    98%  Weight:      Height:        Intake/Output Summary (Last 24 hours) at 05/09/2019 1202 Last data filed at 05/09/2019 1100 Gross per 24 hour  Intake 3553.23 ml  Output 400 ml  Net 3153.23 ml   Filed Weights   05/08/19 1205  Weight: 99.8 kg    Examination:  General exam: Appears calm and comfortable, not in any distress. Respiratory system: Clear to auscultation. Respiratory effort normal. Cardiovascular system: S1 & S2 heard, RRR. No JVD, murmurs, rubs, gallops or clicks. No pedal edema.  Right IJ dialysis catheter. Gastrointestinal system: Abdomen is nondistended, soft and nontender. No organomegaly or masses felt. Normal bowel sounds heard. Central nervous system: Alert and oriented. No focal neurological deficits. Extremities: Symmetric 5 x 5 power. Skin: No rashes, lesions or ulcers Psychiatry: Judgement and insight appear normal. Mood & affect flat.    Data Reviewed: I have personally reviewed following labs and imaging studies  CBC: Recent Labs  Lab 05/08/19 1320 05/09/19 0400  WBC 14.5* 13.6*  HGB 9.5* 8.0*  HCT 28.6* 23.2*  MCV 91.4 88.2  PLT 280 [id number]   Basic Metabolic Panel: Recent  Labs  Lab 05/08/19 1320 05/08/19 2011 05/09/19 0400  NA 139 138 139  K 3.2* 2.9* 2.3*  CL 98 101 100  CO2 9* 9* 16*  GLUCOSE 138* 195* 96  BUN 178* 168* 108*  CREATININE 25.65* 22.63* 15.06*  CALCIUM 4.1* 4.1* 5.2*  PHOS  --  >30.0* 4.6   GFR: Estimated Creatinine Clearance: 6.8 mL/min (A) (by C-G formula based on SCr of 15.06 mg/dL (H)). Liver Function Tests: Recent Labs  Lab 05/08/19 1320 05/08/19 2011 05/09/19 0400  AST 32  --   --    ALT 43  --   --   ALKPHOS 56  --   --   BILITOT 1.0  --   --   PROT 8.7*  --   --   ALBUMIN 3.4* 3.1* 2.9*   Recent Labs  Lab 05/08/19 1320  LIPASE 102*   No results for input(s): AMMONIA in the last 168 hours. Coagulation Profile: Recent Labs  Lab 05/08/19 2011  INR 1.4*   Cardiac Enzymes: No results for input(s): CKTOTAL, CKMB, CKMBINDEX, TROPONINI in the last 168 hours. BNP (last 3 results) No results for input(s): PROBNP in the last 8760 hours. HbA1C: No results for input(s): HGBA1C in the last 72 hours. CBG: No results for input(s): GLUCAP in the last 168 hours. Lipid Profile: No results for input(s): CHOL, HDL, LDLCALC, TRIG, CHOLHDL, LDLDIRECT in the last 72 hours. Thyroid Function Tests: No results for input(s): TSH, T4TOTAL, FREET4, T3FREE, THYROIDAB in the last 72 hours. Anemia Panel: Recent Labs    05/08/19 2011  FERRITIN 711*  TIBC 130*  IRON 113   Sepsis Labs: No results for input(s): PROCALCITON, LATICACIDVEN in the last 168 hours.  Recent Results (from the past 240 hour(s))  SARS CORONAVIRUS 2 Nasal Swab Aptima Multi Swab     Status: None   Collection Time: 05/08/19  2:36 PM   Specimen: Aptima Multi Swab; Nasal Swab  Result Value Ref Range Status   SARS Coronavirus 2 NEGATIVE NEGATIVE Final    Comment: (NOTE) SARS-CoV-2 target nucleic acids are NOT DETECTED. The SARS-CoV-2 RNA is generally detectable in upper and lower respiratory specimens during the acute phase of infection. Negative results do not preclude SARS-CoV-2 infection, do not rule out co-infections with other pathogens, and should not be used as the sole basis for treatment or other patient management decisions. Negative results must be combined with clinical observations, patient history, and epidemiological information. The expected result is Negative. Fact Sheet for Patients: SugarRoll.be Fact Sheet for Healthcare Providers:  https://www.woods-mathews.com/ This test is not yet approved or cleared by the [place] FDA and  has been authorized for detection and/or diagnosis of SARS-CoV-2 by FDA under an Emergency Use Authorization (EUA). This EUA will remain  in effect (meaning this test can be used) for the duration of the COVID-19 declaration under Section 56 4(b)(1) of the Act, 21 U.S.C. section 360bbb-3(b)(1), unless the authorization is terminated or revoked sooner. Performed at [facility], [place] [address] [place], [place] [address]   SARS Coronavirus 2 [facility] order, Performed in [facility] hospital lab) Nasopharyngeal Nasopharyngeal Swab     Status: None   Collection Time: 05/08/19  5:57 PM   Specimen: Nasopharyngeal Swab  Result Value Ref Range Status   SARS Coronavirus 2 NEGATIVE NEGATIVE Final    Comment: (NOTE) If result is NEGATIVE SARS-CoV-2 target nucleic acids are NOT DETECTED. The SARS-CoV-2 RNA is generally detectable in upper and lower  respiratory specimens during  the acute phase of infection. The lowest  concentration of SARS-CoV-2 viral copies this assay can detect is 250  copies / mL. A negative result does not preclude SARS-CoV-2 infection  and should not be used as the sole basis for treatment or other  patient management decisions.  A negative result may occur with  improper specimen collection / handling, submission of specimen other  than nasopharyngeal swab, presence of viral mutation(s) within the  areas targeted by this assay, and inadequate number of viral copies  (<250 copies / mL). A negative result must be combined with clinical  observations, patient history, and epidemiological information. If result is POSITIVE SARS-CoV-2 target nucleic acids are DETECTED. The SARS-CoV-2 RNA is generally detectable in upper and lower  respiratory specimens dur ing the acute phase of infection.  Positive  results are indicative of active infection with  SARS-CoV-2.  Clinical  correlation with patient history and other diagnostic information is  necessary to determine patient infection status.  Positive results do  not rule out bacterial infection or co-infection with other viruses. If result is PRESUMPTIVE POSTIVE SARS-CoV-2 nucleic acids MAY BE PRESENT.   A presumptive positive result was obtained on the submitted specimen  and confirmed on repeat testing.  While 2019 novel coronavirus  (SARS-CoV-2) nucleic acids may be present in the submitted sample  additional confirmatory testing may be necessary for epidemiological  and / or clinical management purposes  to differentiate between  SARS-CoV-2 and other Sarbecovirus currently known to infect humans.  If clinically indicated additional testing with an alternate test  methodology [phone]) is advised. The SARS-CoV-2 RNA is generally  detectable in upper and lower respiratory sp ecimens during the acute  phase of infection. The expected result is Negative. Fact Sheet for Patients:  StrictlyIdeas.no Fact Sheet for Healthcare Providers: BankingDealers.co.za This test is not yet approved or cleared by the [place] FDA and has been authorized for detection and/or diagnosis of SARS-CoV-2 by FDA under an Emergency Use Authorization (EUA).  This EUA will remain in effect (meaning this test can be used) for the duration of the COVID-19 declaration under Section 564(b)(1) of the Act, 21 U.S.C. section 360bbb-3(b)(1), unless the authorization is terminated or revoked sooner. Performed at [facility], [place] [address] [place], [place] [address]   MRSA PCR Screening     Status: None   Collection Time: 05/08/19  9:03 PM   Specimen: Nasopharyngeal  Result Value Ref Range Status   MRSA by PCR NEGATIVE NEGATIVE Final    Comment:        The GeneXpert MRSA Assay (FDA approved for NASAL specimens only), is one component of a comprehensive  MRSA colonization surveillance program. It is not intended to diagnose MRSA infection nor to guide or monitor treatment for MRSA infections. Performed at [facility], [place] [address], [place],  [address]          Radiology Studies: US Renal  Result Date: 05/08/2019 CLINICAL DATA:  Chronic renal disease, stage IV. EXAM: RENAL / URINARY TRACT ULTRASOUND COMPLETE COMPARISON:  November 19, 2016 FINDINGS: Right Kidney: Renal measurements: 16.8 x 6.8 x 6.8 = volume: 401 mL. Increased parenchymal echogenicity with prominent cortical thinning. More than 10 renal cysts are seen with benign appearance, measuring up to 3.5 cm. Left Kidney: Renal measurements: 19.0 by 8.7 x 8.5 cm = volume: 735 mL. Increased parenchymal echogenicity and prominent cortical thinning. More than 10 benign-appearing cysts are seen, the largest measuring 3.9 cm. Bladder:  The bladder is decompressed around urinary Foley and therefore not well visualized. Incidental note of benign-appearing liver cysts. IMPRESSION: Bilateral increased renal parenchymal echogenicity with prominent cortical thinning. More than 10 in each kidney benign-appearing cysts. Electronically Signed   By: Fidela Salisbury M.D.   On: 05/08/2019 17:39   Dg Chest Port 1 View  Result Date: 05/08/2019 CLINICAL DATA:  Central line placement EXAM: PORTABLE CHEST 1 VIEW COMPARISON:  11/19/2016 FINDINGS: Right-sided central venous catheter tip over the SVC. Negative for right pneumothorax. No focal airspace disease or effusion. Stable cardiomediastinal silhouette. IMPRESSION: Right-sided central venous catheter tip over the SVC. Negative for pneumothorax Electronically Signed   By: Donavan Foil M.D.   On: 05/08/2019 22:17   Dg Abd Acute 2+v W 1v Chest  Result Date: 05/08/2019 CLINICAL DATA:  Abdominal pain.  Intermittent nausea and vomiting. EXAM: DG ABDOMEN ACUTE W/ 1V CHEST COMPARISON:  No recent prior. FINDINGS: No acute cardiopulmonary disease. No  bowel distention or free air thoracolumbar spine scoliosis. No acute bony abnormality. Pelvic calcifications consistent phleboliths. IMPRESSION: 1.  No acute cardiopulmonary disease. 2.  No acute intra-abdominal abnormality. Electronically Signed   By: Marcello Moores  Register   On: 05/08/2019 15:56        Scheduled Meds: . calcitRIOL  1 mcg Oral BID  . calcium carbonate  2 tablet Oral TID  . Chlorhexidine Gluconate Cloth  6 each Topical Q0600  . darbepoetin (ARANESP) injection - DIALYSIS  100 mcg Intravenous Q Sat-HD  . docusate sodium  100 mg Oral BID  . heparin  5,000 Units Subcutaneous Q8H  . potassium chloride  20 mEq Oral BID   Continuous Infusions: . sodium chloride    . sodium chloride    . sodium chloride       LOS: 1 day    Time spent: 25 minutes    Barb Merino, MD [facility] Pager [phone]  If 7PM-7AM, please contact night-coverage [email or web address] Password [email or web address] 05/09/2019, 12:02 PM

## 2019-05-09 NOTE — Progress Notes (Signed)
Subjective:  First HD late last night - this AM low K - BUN down to 108- seems better able to comprehend what has happened  Objective Vital signs in last 24 hours: Vitals:   05/09/19 0325 05/09/19 0609 05/09/19 0614 05/09/19 0900  BP:   107/61 113/77  Pulse:  (!) 108    Resp: 19  19 (!) 24  Temp:  98.3 F (36.8 C) 98.2 F (36.8 C)   TempSrc:  Oral Oral   SpO2:    98%  Weight:      Height:       Weight change:   Intake/Output Summary (Last 24 hours) at 05/09/2019 1009 Last data filed at 05/09/2019 0900 Gross per 24 hour  Intake 3503.23 ml  Output 400 ml  Net 3103.23 ml    Assessment/ Plan: Pt is a 53 y.o. 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 S/p first HD last night, will plan for second treatment today and third on Monday.  On Monday start with CLIP and get vein mapping, plan for AVF an conversion of vascath to a TDC 2. HTN/vol-  dry-  Drinking and will give some IVF.Marland Kitchen run even with HD   3. Anemia- iron is good.  Will give ESA  4. Secondary hyperparathyroidism- PTH pending - phos down to 4.6 with HD only- for calcium have him on rocaltrol and tums  5. Hypokalemia- run on 4 K today , also give some repletion   Louis Meckel    Labs: Basic Metabolic Panel: Recent Labs  Lab 05/08/19 1320 05/08/19 2011 05/09/19 0400  NA 139 138 139  K 3.2* 2.9* 2.3*  CL 98 101 100  CO2 9* 9* 16*  GLUCOSE 138* 195* 96  BUN 178* 168* 108*  CREATININE 25.65* 22.63* 15.06*  CALCIUM 4.1* 4.1* 5.2*  PHOS  --  >30.0* 4.6   Liver Function Tests: Recent Labs  Lab 05/08/19 1320 05/08/19 2011 05/09/19 0400  AST 32  --   --   ALT 43  --   --   ALKPHOS 56  --   --   BILITOT 1.0  --   --   PROT 8.7*  --   --   ALBUMIN 3.4* 3.1* 2.9*   Recent Labs  Lab 05/08/19 1320  LIPASE 102*   No results for input(s): AMMONIA in the last 168 hours. CBC: Recent Labs  Lab  05/08/19 1320 05/09/19 0400  WBC 14.5* 13.6*  HGB 9.5* 8.0*  HCT 28.6* 23.2*  MCV 91.4 88.2  PLT 280 217   Cardiac Enzymes: No results for input(s): CKTOTAL, CKMB, CKMBINDEX, TROPONINI in the last 168 hours. CBG: No results for input(s): GLUCAP in the last 168 hours.  Iron Studies:  Recent Labs    05/08/19 2011  IRON 113  TIBC 130*  FERRITIN 711*   Studies/Results: US Renal  Result Date: 05/08/2019 CLINICAL DATA:  Chronic renal disease, stage IV. EXAM: RENAL / URINARY TRACT ULTRASOUND COMPLETE COMPARISON:  November 19, 2016 FINDINGS: Right Kidney: Renal measurements: 16.8 x 6.8 x 6.8 = volume: 401 mL. Increased parenchymal echogenicity with prominent cortical thinning. More than 10 renal cysts are seen with benign appearance, measuring up to 3.5 cm. Left Kidney: Renal measurements: 19.0 by 8.7 x 8.5 cm = volume: 735 mL. Increased parenchymal echogenicity and prominent cortical thinning. More than 10 benign-appearing cysts  are seen, the largest measuring 3.9 cm. Bladder: The bladder is decompressed around urinary Foley and therefore not well visualized. Incidental note of benign-appearing liver cysts. IMPRESSION: Bilateral increased renal parenchymal echogenicity with prominent cortical thinning. More than 10 in each kidney benign-appearing cysts. Electronically Signed   By: Fidela Salisbury M.D.   On: 05/08/2019 17:39   Dg Chest Port 1 View  Result Date: 05/08/2019 CLINICAL DATA:  Central line placement EXAM: PORTABLE CHEST 1 VIEW COMPARISON:  11/19/2016 FINDINGS: Right-sided central venous catheter tip over the SVC. Negative for right pneumothorax. No focal airspace disease or effusion. Stable cardiomediastinal silhouette. IMPRESSION: Right-sided central venous catheter tip over the SVC. Negative for pneumothorax Electronically Signed   By: Donavan Foil M.D.   On: 05/08/2019 22:17   Dg Abd Acute 2+v W 1v Chest  Result Date: 05/08/2019 CLINICAL DATA:  Abdominal pain.  Intermittent  nausea and vomiting. EXAM: DG ABDOMEN ACUTE W/ 1V CHEST COMPARISON:  No recent prior. FINDINGS: No acute cardiopulmonary disease. No bowel distention or free air thoracolumbar spine scoliosis. No acute bony abnormality. Pelvic calcifications consistent phleboliths. IMPRESSION: 1.  No acute cardiopulmonary disease. 2.  No acute intra-abdominal abnormality. Electronically Signed   By: Marcello Moores  Register   On: 05/08/2019 15:56   Medications: Infusions: . sodium chloride    . sodium chloride      Scheduled Medications: . calcitRIOL  1 mcg Oral BID  . calcium carbonate  2 tablet Oral TID  . Chlorhexidine Gluconate Cloth  6 each Topical Q0600  . darbepoetin (ARANESP) injection - DIALYSIS  25 mcg Intravenous Q Sat-HD  . docusate sodium  100 mg Oral BID  . heparin  5,000 Units Subcutaneous Q8H    have reviewed scheduled and prn medications.  Physical Exam: General: more alert Heart: RRR Lungs: mostly clear Abdomen: soft, non tender Extremities: no edema Dialysis Access: right IJ vascath     05/09/2019,10:09 AM  LOS: 1 day

## 2019-05-09 NOTE — Progress Notes (Signed)
CRITICAL VALUE ALERT  Critical Value:  Potassium 2.3 and calcium 5.2  Date & Time Notied: 05/09/19 at 0540  Provider Notified: Kennon Holter, NP  Orders Received/Actions taken: New orders placed

## 2019-05-10 DIAGNOSIS — E876 Hypokalemia: Secondary | ICD-10-CM | POA: Diagnosis present

## 2019-05-10 DIAGNOSIS — D638 Anemia in other chronic diseases classified elsewhere: Secondary | ICD-10-CM

## 2019-05-10 LAB — CBC WITH DIFFERENTIAL/PLATELET
Abs Immature Granulocytes: 0.06 10*3/uL (ref 0.00–0.07)
Basophils Absolute: 0 10*3/uL (ref 0.0–0.1)
Basophils Relative: 0 %
Eosinophils Absolute: 0.8 10*3/uL — ABNORMAL HIGH (ref 0.0–0.5)
Eosinophils Relative: 8 %
HCT: 23.1 % — ABNORMAL LOW (ref 39.0–52.0)
Hemoglobin: 7.4 g/dL — ABNORMAL LOW (ref 13.0–17.0)
Immature Granulocytes: 1 %
Lymphocytes Relative: 7 %
Lymphs Abs: 0.7 10*3/uL (ref 0.7–4.0)
MCH: 29.7 pg (ref 26.0–34.0)
MCHC: 32 g/dL (ref 30.0–36.0)
MCV: 92.8 fL (ref 80.0–100.0)
Monocytes Absolute: 1 10*3/uL (ref 0.1–1.0)
Monocytes Relative: 10 %
Neutro Abs: 7.7 10*3/uL (ref 1.7–7.7)
Neutrophils Relative %: 74 %
Platelets: 198 10*3/uL (ref 150–400)
RBC: 2.49 MIL/uL — ABNORMAL LOW (ref 4.22–5.81)
RDW: 14.5 % (ref 11.5–15.5)
WBC: 10.4 10*3/uL (ref 4.0–10.5)
nRBC: 0 % (ref 0.0–0.2)

## 2019-05-10 LAB — COMPREHENSIVE METABOLIC PANEL
ALT: 33 U/L (ref 0–44)
AST: 32 U/L (ref 15–41)
Albumin: 2.6 g/dL — ABNORMAL LOW (ref 3.5–5.0)
Alkaline Phosphatase: 41 U/L (ref 38–126)
Anion gap: 21 — ABNORMAL HIGH (ref 5–15)
BUN: 111 mg/dL — ABNORMAL HIGH (ref 6–20)
CO2: 17 mmol/L — ABNORMAL LOW (ref 22–32)
Calcium: 5.4 mg/dL — CL (ref 8.9–10.3)
Chloride: 101 mmol/L (ref 98–111)
Creatinine, Ser: 15.06 mg/dL — ABNORMAL HIGH (ref 0.61–1.24)
GFR calc Af Amer: 4 mL/min — ABNORMAL LOW (ref >60)
GFR calc non Af Amer: 3 mL/min — ABNORMAL LOW (ref >60)
Glucose, Bld: 105 mg/dL — ABNORMAL HIGH (ref 70–99)
Potassium: 2.8 mmol/L — ABNORMAL LOW (ref 3.5–5.1)
Sodium: 139 mmol/L (ref 135–145)
Total Bilirubin: 0.7 mg/dL (ref 0.3–1.2)
Total Protein: 6.4 g/dL — ABNORMAL LOW (ref 6.5–8.1)

## 2019-05-10 LAB — RENAL FUNCTION PANEL
Albumin: 2.9 g/dL — ABNORMAL LOW (ref 3.5–5.0)
Anion gap: 23 — ABNORMAL HIGH (ref 5–15)
BUN: 108 mg/dL — ABNORMAL HIGH (ref 6–20)
CO2: 16 mmol/L — ABNORMAL LOW (ref 22–32)
Calcium: 5.2 mg/dL — CL (ref 8.9–10.3)
Chloride: 100 mmol/L (ref 98–111)
Creatinine, Ser: 15.06 mg/dL — ABNORMAL HIGH (ref 0.61–1.24)
GFR calc Af Amer: 4 mL/min — ABNORMAL LOW (ref >60)
GFR calc non Af Amer: 3 mL/min — ABNORMAL LOW (ref >60)
Glucose, Bld: 96 mg/dL (ref 70–99)
Phosphorus: 4.6 mg/dL (ref 2.5–4.6)
Potassium: 2.3 mmol/L — CL (ref 3.5–5.1)
Sodium: 139 mmol/L (ref 135–145)

## 2019-05-10 LAB — PARATHYROID HORMONE, INTACT (NO CA): PTH: 221 pg/mL — ABNORMAL HIGH (ref 15–65)

## 2019-05-10 LAB — MAGNESIUM: Magnesium: 1.5 mg/dL — ABNORMAL LOW (ref 1.7–2.4)

## 2019-05-10 LAB — PHOSPHORUS: Phosphorus: 5.2 mg/dL — ABNORMAL HIGH (ref 2.5–4.6)

## 2019-05-10 MED ORDER — HEPARIN SODIUM (PORCINE) 1000 UNIT/ML IJ SOLN
INTRAMUSCULAR | Status: AC
  Administered 2019-05-10: 12:00:00
  Filled 2019-05-10: qty 3

## 2019-05-10 MED ORDER — DARBEPOETIN ALFA 100 MCG/0.5ML IJ SOSY
100.0000 ug | PREFILLED_SYRINGE | Freq: Once | INTRAMUSCULAR | Status: AC
  Administered 2019-05-10: 100 ug via INTRAVENOUS

## 2019-05-10 MED ORDER — SODIUM CHLORIDE 0.9 % IV SOLN
INTRAVENOUS | Status: DC
  Administered 2019-05-10 – 2019-05-14 (×7): via INTRAVENOUS

## 2019-05-10 MED ORDER — MAGNESIUM SULFATE 2 GM/50ML IV SOLN
2.0000 g | Freq: Once | INTRAVENOUS | Status: AC
  Administered 2019-05-10: 2 g via INTRAVENOUS
  Filled 2019-05-10: qty 50

## 2019-05-10 MED ORDER — CALCIUM GLUCONATE-NACL 1-0.675 GM/50ML-% IV SOLN
1.0000 g | Freq: Once | INTRAVENOUS | Status: AC
  Administered 2019-05-10: 1000 mg via INTRAVENOUS
  Filled 2019-05-10: qty 50

## 2019-05-10 MED ORDER — CHLORHEXIDINE GLUCONATE CLOTH 2 % EX PADS: 6.0000 | MEDICATED_PAD | Freq: Every day | CUTANEOUS | Status: DC

## 2019-05-10 MED ORDER — POTASSIUM CHLORIDE CRYS ER 20 MEQ PO TBCR
40.0000 meq | EXTENDED_RELEASE_TABLET | Freq: Once | ORAL | Status: AC
  Administered 2019-05-10: 40 meq via ORAL
  Filled 2019-05-10: qty 2

## 2019-05-10 MED ORDER — DARBEPOETIN ALFA 100 MCG/0.5ML IJ SOSY
PREFILLED_SYRINGE | INTRAMUSCULAR | Status: AC
  Administered 2019-05-10: 100 ug via INTRAVENOUS
  Filled 2019-05-10: qty 0.5

## 2019-05-10 NOTE — Progress Notes (Signed)
Subjective:  His second treatment got bumped to today- seen on HD , feeling better- made 2 liters of urine !   Objective Vital signs in last 24 hours: Vitals:   05/10/19 0754 05/10/19 0800 05/10/19 0830 05/10/19 0900  BP: 109/72 110/67 100/73 122/73  Pulse: 80 79 70 90  Resp:      Temp:      TempSrc:      SpO2:      Weight:      Height:       Weight change:   Intake/Output Summary (Last 24 hours) at 05/10/2019 0910 Last data filed at 05/10/2019 0700 Gross per 24 hour  Intake 812.42 ml  Output 1850 ml  Net -1037.58 ml    Assessment/ Plan: Pt is a 53 y.o. 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 S/p first HD night of 8/7,  second treatment today and third on Monday.  On Monday start with CLIP and get vein mapping and consult [facility] plan for AVF and conversion of vascath to a TDC- both by [facility], do not need IR to place [facility]   2. HTN/vol-  dry-  Drinking and will also give some IVF. run even with HD- making urine, a good amount but still feel he is ESRD   3. Anemia- iron is good.  Will give ESA  4. Secondary hyperparathyroidism- PTH pending - phos down to 4.6-5.2  with HD only- for calcium have him on rocaltrol and tums  5. Hypokalemia- run on 4 K today , also give some repletion   Louis Meckel    Labs: Basic Metabolic Panel: Recent Labs  Lab 05/08/19 2011 05/09/19 0400 05/10/19 0446  NA 138 139 139  K 2.9* 2.3* 2.8*  CL 101 100 101  CO2 9* 16* 17*  GLUCOSE 195* 96 105*  BUN 168* 108* 111*  CREATININE 22.63* 15.06* 15.06*  CALCIUM 4.1* 5.2* 5.4*  PHOS >30.0* 4.6 5.2*   Liver Function Tests: Recent Labs  Lab 05/08/19 1320 05/08/19 2011 05/09/19 0400 05/10/19 0446  AST 32  --   --  32  ALT 43  --   --  33  ALKPHOS 56  --   --  41  BILITOT 1.0  --   --  0.7  PROT 8.7*  --   --  6.4*  ALBUMIN 3.4* 3.1* 2.9* 2.6*   Recent Labs  Lab  05/08/19 1320  LIPASE 102*   No results for input(s): AMMONIA in the last 168 hours. CBC: Recent Labs  Lab 05/08/19 1320 05/09/19 0400 05/10/19 0446  WBC 14.5* 13.6* 10.4  NEUTROABS  --   --  7.7  HGB 9.5* 8.0* 7.4*  HCT 28.6* 23.2* 23.1*  MCV 91.4 88.2 92.8  PLT 280 217 198   Cardiac Enzymes: No results for input(s): CKTOTAL, CKMB, CKMBINDEX, TROPONINI in the last 168 hours. CBG: No results for input(s): GLUCAP in the last 168 hours.  Iron Studies:  Recent Labs    05/08/19 2011  IRON 113  TIBC 130*  FERRITIN 711*   Studies/Results: US Renal  Result Date: 05/08/2019 CLINICAL DATA:  Chronic renal disease, stage IV. EXAM: RENAL / URINARY TRACT ULTRASOUND COMPLETE COMPARISON:  November 19, 2016 FINDINGS: Right Kidney: Renal measurements: 16.8 x 6.8 x 6.8 = volume: 401 mL. Increased parenchymal echogenicity with prominent cortical thinning. More than 10  renal cysts are seen with benign appearance, measuring up to 3.5 cm. Left Kidney: Renal measurements: 19.0 by 8.7 x 8.5 cm = volume: 735 mL. Increased parenchymal echogenicity and prominent cortical thinning. More than 10 benign-appearing cysts are seen, the largest measuring 3.9 cm. Bladder: The bladder is decompressed around urinary Foley and therefore not well visualized. Incidental note of benign-appearing liver cysts. IMPRESSION: Bilateral increased renal parenchymal echogenicity with prominent cortical thinning. More than 10 in each kidney benign-appearing cysts. Electronically Signed   By: Fidela Salisbury M.D.   On: 05/08/2019 17:39   Dg Chest Port 1 View  Result Date: 05/08/2019 CLINICAL DATA:  Central line placement EXAM: PORTABLE CHEST 1 VIEW COMPARISON:  11/19/2016 FINDINGS: Right-sided central venous catheter tip over the SVC. Negative for right pneumothorax. No focal airspace disease or effusion. Stable cardiomediastinal silhouette. IMPRESSION: Right-sided central venous catheter tip over the SVC. Negative for  pneumothorax Electronically Signed   By: Donavan Foil M.D.   On: 05/08/2019 22:17   Dg Abd Acute 2+v W 1v Chest  Result Date: 05/08/2019 CLINICAL DATA:  Abdominal pain.  Intermittent nausea and vomiting. EXAM: DG ABDOMEN ACUTE W/ 1V CHEST COMPARISON:  No recent prior. FINDINGS: No acute cardiopulmonary disease. No bowel distention or free air thoracolumbar spine scoliosis. No acute bony abnormality. Pelvic calcifications consistent phleboliths. IMPRESSION: 1.  No acute cardiopulmonary disease. 2.  No acute intra-abdominal abnormality. Electronically Signed   By: Marcello Moores  Register   On: 05/08/2019 15:56   Medications: Infusions: . sodium chloride    . sodium chloride    . calcium gluconate    . magnesium sulfate bolus IVPB      Scheduled Medications: . calcitRIOL  1 mcg Oral BID  . calcium carbonate  2 tablet Oral TID  . Chlorhexidine Gluconate Cloth  6 each Topical Q0600  . darbepoetin (ARANESP) injection - DIALYSIS  100 mcg Intravenous Q Sat-HD  . docusate sodium  100 mg Oral BID  . feeding supplement (NEPRO CARB STEADY)  237 mL Oral BID BM  . heparin  5,000 Units Subcutaneous Q8H  . multivitamin  1 tablet Oral QHS  . potassium chloride  20 mEq Oral BID  . potassium chloride  40 mEq Oral Once    have reviewed scheduled and prn medications.  Physical Exam: General: more alert Heart: RRR Lungs: mostly clear Abdomen: soft, non tender Extremities: no edema Dialysis Access: right IJ vascath     05/10/2019,9:10 AM  LOS: 2 days

## 2019-05-10 NOTE — Plan of Care (Signed)
  Problem: Education: Goal: Knowledge of disease and its progression will improve Outcome: Progressing   Problem: Health Behavior/Discharge Planning: Goal: Ability to manage health-related needs will improve Outcome: Progressing   Problem: Clinical Measurements: Goal: Complications related to the disease process or treatment will be avoided or minimized Outcome: Progressing Goal: Dialysis access will remain free of complications Outcome: Progressing   Problem: Activity: Goal: Activity intolerance will improve Outcome: Progressing   Problem: Fluid Volume: Goal: Fluid volume balance will be maintained or improved Outcome: Progressing   Problem: Nutritional: Goal: Ability to make appropriate dietary choices will improve Outcome: Progressing   Problem: Respiratory: Goal: Respiratory symptoms related to disease process will be avoided Outcome: Progressing   Problem: Self-Concept: Goal: Body image disturbance will be avoided or minimized Outcome: Progressing   Problem: Urinary Elimination: Goal: Progression of disease will be identified and treated Outcome: Progressing   Problem: Health Behavior/Discharge Planning: Goal: Ability to manage health-related needs will improve Outcome: Progressing   Problem: Coping: Goal: Level of anxiety will decrease Outcome: Progressing   Problem: Pain Managment: Goal: General experience of comfort will improve Outcome: Progressing   Problem: Safety: Goal: Ability to remain free from injury will improve Outcome: Progressing   Problem: Skin Integrity: Goal: Risk for impaired skin integrity will decrease Outcome: Progressing   Problem: Education: Goal: Knowledge of General Education information will improve Description: Including pain rating scale, medication(s)/side effects and non-pharmacologic comfort measures Outcome: Progressing   Problem: Clinical Measurements: Goal: Ability to maintain clinical measurements within normal  limits will improve Outcome: Progressing Goal: Will remain free from infection Outcome: Progressing Goal: Diagnostic test results will improve Outcome: Progressing Goal: Respiratory complications will improve Outcome: Progressing Goal: Cardiovascular complication will be avoided Outcome: Progressing   Problem: Activity: Goal: Risk for activity intolerance will decrease Outcome: Progressing   Problem: Nutrition: Goal: Adequate nutrition will be maintained Outcome: Progressing   Problem: Elimination: Goal: Will not experience complications related to bowel motility Outcome: Progressing Goal: Will not experience complications related to urinary retention Outcome: Progressing

## 2019-05-10 NOTE — Progress Notes (Addendum)
PROGRESS NOTE    Bernard Jordan  [id number] DOB: 04-Feb-1966 DOA: 05/08/2019 PCP: Patient, No Pcp Per    Brief Narrative:  Bernard Jordan  is a 53 y.o. male, with past medical history of polycystic kidney disease, chronic kidney disease most recent creatinine was around 6 range 2 years ago but apparently patient does not follow with any physicians. Patient presented to ED secondary to complaints of nausea and vomiting, reportedly he had a seafood and steak about a week ago, has been feeling nauseated since, with poor oral intake, unable to eat or drink without nausea, as well he did report itching, lethargy, and not feeling well, he denied chest pain, shortness of breath, fever, chills but denied dysuria or polyuria. - in ED patient noted to have creatinine of 25, BUN of 78, bicarb of 9, calcium of 4.9, mild leukocytosis at 14.5, lipase of 102. Admitted under [facility] with nephrology consultation and started on HD with right IJ catheter.  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 Admitted and received a temporary catheter and underwent hemodialysis on 05/08/2019.  Clinically improving.  Seen and followed by nephrology.  Patient will have subsequent dialysis. IR consulted for tunnel catheter, Monday morning. Patient will probably end up needing permanent hemodialysis.  Hypocalcemia: Replace again today.  Hypomagnesemia: Replaced with IV magnesium sulfate.  Hypokalemia: 2.8.  Replace orally.  Further management per nephrology.    Anemia of chronic disease: Presented with hemoglobin of over 9 but currently 7.4.  No signs of bleeding.  Monitor closely and transfuse if drops less than 7.  DVT prophylaxis: Heparin subcu Code Status: Full code Family  Communication: None Disposition Plan: Potential discharge in next 1 to 2 days.  Consultants:   Nephrology  Procedures:   Hemodialysis  Antimicrobials:   None   Subjective: Patient seen and examined in dialysis unit.  He felt a little restlessness but no other complaint.  He was alert and oriented.  Objective: Vitals:   05/10/19 0830 05/10/19 0900 05/10/19 0930 05/10/19 1000  BP: 100/73 122/73 127/62 (!) 85/55  Pulse: 70 90 79 (!) 118  Resp:      Temp:      TempSrc:      SpO2:      Weight:      Height:        Intake/Output Summary (Last 24 hours) at 05/10/2019 1051 Last data filed at 05/10/2019 0700 Gross per 24 hour  Intake 812.42 ml  Output 1850 ml  Net -1037.58 ml   Filed Weights   05/08/19 1205 05/10/19 0745  Weight: 99.8 kg 89.4 kg    Examination:  General exam: Appears calm and comfortable  Respiratory system: Clear to auscultation. Respiratory effort normal. Cardiovascular system: S1 & S2 heard, RRR. No JVD, murmurs, rubs, gallops or clicks. No pedal edema. Gastrointestinal system: Abdomen is nondistended, soft and nontender. No organomegaly or masses felt. Normal bowel sounds heard. Central nervous system: Alert and oriented. No focal neurological deficits. Extremities: Symmetric 5 x 5 power. Skin: No rashes, lesions or ulcers Psychiatry: Judgement and insight appear normal. Mood & affect appropriate.   Data Reviewed: I have personally reviewed following labs and imaging studies  CBC: Recent Labs  Lab 05/08/19 1320 05/09/19 0400 05/10/19 0446  WBC 14.5* 13.6* 10.4  NEUTROABS  --   --  7.7  HGB 9.5* 8.0* 7.4*  HCT 28.6* 23.2* 23.1*  MCV 91.4 88.2 92.8  PLT 280 217 [id number]   Basic Metabolic Panel: Recent Labs  Lab 05/08/19 1320 05/08/19 2011 05/09/19 0400 05/10/19 0446  NA 139 138 139 139  K 3.2* 2.9* 2.3* 2.8*  CL 98 101 100 101  CO2 9* 9* 16* 17*  GLUCOSE 138* 195* 96 105*  BUN 178* 168* 108* 111*  CREATININE 25.65* 22.63* 15.06*  15.06*  CALCIUM 4.1* 4.1* 5.2* 5.4*  MG  --   --   --  1.5*  PHOS  --  >30.0* 4.6 5.2*   GFR: Estimated Creatinine Clearance: 6 mL/min (A) (by C-G formula based on SCr of 15.06 mg/dL (H)). Liver Function Tests: Recent Labs  Lab 05/08/19 1320 05/08/19 2011 05/09/19 0400 05/10/19 0446  AST 32  --   --  32  ALT 43  --   --  33  ALKPHOS 56  --   --  41  BILITOT 1.0  --   --  0.7  PROT 8.7*  --   --  6.4*  ALBUMIN 3.4* 3.1* 2.9* 2.6*   Recent Labs  Lab 05/08/19 1320  LIPASE 102*   No results for input(s): AMMONIA in the last 168 hours. Coagulation Profile: Recent Labs  Lab 05/08/19 2011  INR 1.4*   Cardiac Enzymes: No results for input(s): CKTOTAL, CKMB, CKMBINDEX, TROPONINI in the last 168 hours. BNP (last 3 results) No results for input(s): PROBNP in the last 8760 hours. HbA1C: No results for input(s): HGBA1C in the last 72 hours. CBG: No results for input(s): GLUCAP in the last 168 hours. Lipid Profile: No results for input(s): CHOL, HDL, LDLCALC, TRIG, CHOLHDL, LDLDIRECT in the last 72 hours. Thyroid Function Tests: No results for input(s): TSH, T4TOTAL, FREET4, T3FREE, THYROIDAB in the last 72 hours. Anemia Panel: Recent Labs    05/08/19 2011  FERRITIN 711*  TIBC 130*  IRON 113   Sepsis Labs: No results for input(s): PROCALCITON, LATICACIDVEN in the last 168 hours.  Recent Results (from the past 240 hour(s))  SARS CORONAVIRUS 2 Nasal Swab Aptima Multi Swab     Status: None   Collection Time: 05/08/19  2:36 PM   Specimen: Aptima Multi Swab; Nasal Swab  Result Value Ref Range Status   SARS Coronavirus 2 NEGATIVE NEGATIVE Final    Comment: (NOTE) SARS-CoV-2 target nucleic acids are NOT DETECTED. The SARS-CoV-2 RNA is generally detectable in upper and lower respiratory specimens during the acute phase of infection. Negative results do not preclude SARS-CoV-2 infection, do not rule out co-infections with other pathogens, and should not be used as the  sole basis for treatment or other patient management decisions. Negative results must be combined with clinical observations, patient history, and epidemiological information. The expected result is Negative. Fact Sheet for Patients: SugarRoll.be Fact Sheet for Healthcare Providers: https://www.woods-mathews.com/ This test is not yet approved or cleared by the [place] FDA and  has been authorized for detection and/or diagnosis of SARS-CoV-2 by FDA under an Emergency Use Authorization (EUA). This EUA will remain  in effect (meaning this test can be used) for the duration of the COVID-19 declaration under Section 56 4(b)(1) of the Act, 21 U.S.C. section 360bbb-3(b)(1), unless the authorization is terminated or revoked sooner. Performed at [facility], [place] [address] [place], [place] [address]   SARS Coronavirus 2 [facility] order, Performed in  [facility] hospital lab) Nasopharyngeal Nasopharyngeal Swab     Status: None   Collection Time: 05/08/19  5:57 PM   Specimen: Nasopharyngeal Swab  Result Value Ref Range Status   SARS Coronavirus 2 NEGATIVE NEGATIVE Final    Comment: (NOTE) If result is NEGATIVE SARS-CoV-2 target nucleic acids are NOT DETECTED. The SARS-CoV-2 RNA is generally detectable in upper and lower  respiratory specimens during the acute phase of infection. The lowest  concentration of SARS-CoV-2 viral copies this assay can detect is 250  copies / mL. A negative result does not preclude SARS-CoV-2 infection  and should not be used as the sole basis for treatment or other  patient management decisions.  A negative result may occur with  improper specimen collection / handling, submission of specimen other  than nasopharyngeal swab, presence of viral mutation(s) within the  areas targeted by this assay, and inadequate number of viral copies  (<250 copies / mL). A negative result must be combined with clinical   observations, patient history, and epidemiological information. If result is POSITIVE SARS-CoV-2 target nucleic acids are DETECTED. The SARS-CoV-2 RNA is generally detectable in upper and lower  respiratory specimens dur ing the acute phase of infection.  Positive  results are indicative of active infection with SARS-CoV-2.  Clinical  correlation with patient history and other diagnostic information is  necessary to determine patient infection status.  Positive results do  not rule out bacterial infection or co-infection with other viruses. If result is PRESUMPTIVE POSTIVE SARS-CoV-2 nucleic acids MAY BE PRESENT.   A presumptive positive result was obtained on the submitted specimen  and confirmed on repeat testing.  While 2019 novel coronavirus  (SARS-CoV-2) nucleic acids may be present in the submitted sample  additional confirmatory testing may be necessary for epidemiological  and / or clinical management purposes  to differentiate between  SARS-CoV-2 and other Sarbecovirus currently known to infect humans.  If clinically indicated additional testing with an alternate test  methodology [id number]) is advised. The SARS-CoV-2 RNA is generally  detectable in upper and lower respiratory sp ecimens during the acute  phase of infection. The expected result is Negative. Fact Sheet for Patients:  StrictlyIdeas.no Fact Sheet for Healthcare Providers: BankingDealers.co.za This test is not yet approved or cleared by the [place] FDA and has been authorized for detection and/or diagnosis of SARS-CoV-2 by FDA under an Emergency Use Authorization (EUA).  This EUA will remain in effect (meaning this test can be used) for the duration of the COVID-19 declaration under Section 564(b)(1) of the Act, 21 U.S.C. section 360bbb-3(b)(1), unless the authorization is terminated or revoked sooner. Performed at [facility], [place] [address]  [place], [place] [address]   MRSA PCR Screening     Status: None   Collection Time: 05/08/19  9:03 PM   Specimen: Nasopharyngeal  Result Value Ref Range Status   MRSA by PCR NEGATIVE NEGATIVE Final    Comment:        The GeneXpert MRSA Assay (FDA approved for NASAL specimens only), is one component of a comprehensive MRSA colonization surveillance program. It is not intended to diagnose MRSA infection nor to guide or monitor treatment for MRSA infections. Performed at [facility], [place] [address] [place], [place] [address]      Radiology Studies: US Renal  Result Date: 05/08/2019 CLINICAL DATA:  Chronic renal disease, stage IV. EXAM: RENAL / URINARY TRACT ULTRASOUND COMPLETE COMPARISON:  November 19, 2016 FINDINGS: Right Kidney: Renal  measurements: 16.8 x 6.8 x 6.8 = volume: 401 mL. Increased parenchymal echogenicity with prominent cortical thinning. More than 10 renal cysts are seen with benign appearance, measuring up to 3.5 cm. Left Kidney: Renal measurements: 19.0 by 8.7 x 8.5 cm = volume: 735 mL. Increased parenchymal echogenicity and prominent cortical thinning. More than 10 benign-appearing cysts are seen, the largest measuring 3.9 cm. Bladder: The bladder is decompressed around urinary Foley and therefore not well visualized. Incidental note of benign-appearing liver cysts. IMPRESSION: Bilateral increased renal parenchymal echogenicity with prominent cortical thinning. More than 10 in each kidney benign-appearing cysts. Electronically Signed   By: Fidela Salisbury M.D.   On: 05/08/2019 17:39   Dg Chest Port 1 View  Result Date: 05/08/2019 CLINICAL DATA:  Central line placement EXAM: PORTABLE CHEST 1 VIEW COMPARISON:  11/19/2016 FINDINGS: Right-sided central venous catheter tip over the SVC. Negative for right pneumothorax. No focal airspace disease or effusion. Stable cardiomediastinal silhouette. IMPRESSION: Right-sided central venous catheter tip over the SVC. Negative for  pneumothorax Electronically Signed   By: Donavan Foil M.D.   On: 05/08/2019 22:17   Dg Abd Acute 2+v W 1v Chest  Result Date: 05/08/2019 CLINICAL DATA:  Abdominal pain.  Intermittent nausea and vomiting. EXAM: DG ABDOMEN ACUTE W/ 1V CHEST COMPARISON:  No recent prior. FINDINGS: No acute cardiopulmonary disease. No bowel distention or free air thoracolumbar spine scoliosis. No acute bony abnormality. Pelvic calcifications consistent phleboliths. IMPRESSION: 1.  No acute cardiopulmonary disease. 2.  No acute intra-abdominal abnormality. Electronically Signed   By: [place]   On: 05/08/2019 15:56        Scheduled Meds: . heparin      . calcitRIOL  1 mcg Oral BID  . calcium carbonate  2 tablet Oral TID  . Chlorhexidine Gluconate Cloth  6 each Topical Q0600  . darbepoetin (ARANESP) injection - DIALYSIS  100 mcg Intravenous Q Sat-HD  . docusate sodium  100 mg Oral BID  . feeding supplement (NEPRO CARB STEADY)  237 mL Oral BID BM  . heparin  5,000 Units Subcutaneous Q8H  . multivitamin  1 tablet Oral QHS  . potassium chloride  20 mEq Oral BID  . potassium chloride  40 mEq Oral Once   Continuous Infusions: . sodium chloride    . sodium chloride    . sodium chloride    . calcium gluconate    . magnesium sulfate bolus IVPB       LOS: 2 days    Time spent: 26 minutes    Darliss Cheney, MD [facility] Pager [phone]  If 7PM-7AM, please contact night-coverage [email or web address] Password [email or web address] 05/10/2019, 10:51 AM

## 2019-05-10 NOTE — Procedures (Signed)
Patient was seen on dialysis and the procedure was supervised.  BFR 250  Via vascath BP is  122/73.   Patient appears to be tolerating treatment well  Louis Meckel 05/10/2019

## 2019-05-11 ENCOUNTER — Inpatient Hospital Stay (HOSPITAL_COMMUNITY): Payer: Medicaid Other

## 2019-05-11 DIAGNOSIS — N186 End stage renal disease: Secondary | ICD-10-CM

## 2019-05-11 DIAGNOSIS — N185 Chronic kidney disease, stage 5: Secondary | ICD-10-CM

## 2019-05-11 DIAGNOSIS — D638 Anemia in other chronic diseases classified elsewhere: Secondary | ICD-10-CM

## 2019-05-11 LAB — CBC WITH DIFFERENTIAL/PLATELET
Abs Immature Granulocytes: 0.13 10*3/uL — ABNORMAL HIGH (ref 0.00–0.07)
Basophils Absolute: 0 10*3/uL (ref 0.0–0.1)
Basophils Relative: 0 %
Eosinophils Absolute: 0.7 10*3/uL — ABNORMAL HIGH (ref 0.0–0.5)
Eosinophils Relative: 7 %
HCT: 23.2 % — ABNORMAL LOW (ref 39.0–52.0)
Hemoglobin: 7.4 g/dL — ABNORMAL LOW (ref 13.0–17.0)
Immature Granulocytes: 1 %
Lymphocytes Relative: 10 %
Lymphs Abs: 1 10*3/uL (ref 0.7–4.0)
MCH: 30.6 pg (ref 26.0–34.0)
MCHC: 31.9 g/dL (ref 30.0–36.0)
MCV: 95.9 fL (ref 80.0–100.0)
Monocytes Absolute: 1 10*3/uL (ref 0.1–1.0)
Monocytes Relative: 10 %
Neutro Abs: 6.9 10*3/uL (ref 1.7–7.7)
Neutrophils Relative %: 72 %
Platelets: 191 10*3/uL (ref 150–400)
RBC: 2.42 MIL/uL — ABNORMAL LOW (ref 4.22–5.81)
RDW: 14.7 % (ref 11.5–15.5)
WBC: 9.8 10*3/uL (ref 4.0–10.5)
nRBC: 0 % (ref 0.0–0.2)

## 2019-05-11 LAB — RENAL FUNCTION PANEL
Albumin: 2.4 g/dL — ABNORMAL LOW (ref 3.5–5.0)
Anion gap: 14 (ref 5–15)
BUN: 49 mg/dL — ABNORMAL HIGH (ref 6–20)
CO2: 21 mmol/L — ABNORMAL LOW (ref 22–32)
Calcium: 6.1 mg/dL — CL (ref 8.9–10.3)
Chloride: 103 mmol/L (ref 98–111)
Creatinine, Ser: 8 mg/dL — ABNORMAL HIGH (ref 0.61–1.24)
GFR calc Af Amer: 8 mL/min — ABNORMAL LOW (ref >60)
GFR calc non Af Amer: 7 mL/min — ABNORMAL LOW (ref >60)
Glucose, Bld: 105 mg/dL — ABNORMAL HIGH (ref 70–99)
Phosphorus: 2.1 mg/dL — ABNORMAL LOW (ref 2.5–4.6)
Potassium: 2.9 mmol/L — ABNORMAL LOW (ref 3.5–5.1)
Sodium: 138 mmol/L (ref 135–145)

## 2019-05-11 LAB — HEPATITIS B SURFACE ANTIGEN: Hepatitis B Surface Ag: NEGATIVE

## 2019-05-11 LAB — HEPATITIS B SURFACE ANTIBODY,QUALITATIVE: Hep B S Ab: NONREACTIVE

## 2019-05-11 LAB — MAGNESIUM: Magnesium: 1.7 mg/dL (ref 1.7–2.4)

## 2019-05-11 LAB — HEPATITIS B CORE ANTIBODY, TOTAL: Hep B Core Total Ab: NEGATIVE

## 2019-05-11 MED ORDER — HEPARIN SODIUM (PORCINE) 1000 UNIT/ML IJ SOLN
INTRAMUSCULAR | Status: AC
  Filled 2019-05-11: qty 4

## 2019-05-11 MED ORDER — POTASSIUM CHLORIDE CRYS ER 20 MEQ PO TBCR
40.0000 meq | EXTENDED_RELEASE_TABLET | Freq: Once | ORAL | Status: AC
  Administered 2019-05-11: 40 meq via ORAL
  Filled 2019-05-11: qty 2

## 2019-05-11 MED ORDER — HEPARIN SODIUM (PORCINE) 1000 UNIT/ML DIALYSIS: 20.0000 [IU]/kg | INTRAMUSCULAR | Status: DC | PRN

## 2019-05-11 NOTE — H&P (View-Only) (Signed)
Hospital Consult    Reason for Consult:  In need of HD and [facility] Requesting Physician:  Moshe Cipro MRN #:  [id number]  History of Present Illness: This is a 53 y.o. male who was admitted a couple of days ago with nausea and vomiting.  He had eaten some steak and seafood ~ a week ago and has felt nauseated since then.  When he reported to the ED, he was found to have a creatinine of 25 and BUN of 178.  A temporary catheter was placed and he underwent urgent HD.  He states since HD, his N/V has improved.    He is right hand dominant.    He denies any hx of heart disease or stroke.    The pt is not on a statin for cholesterol management.  The pt is not on a daily aspirin.   Other AC:  Sq heparin for DVT prophylaxis The pt is not on meds for hypertension.   The pt is not diabetic.   Tobacco hx:  never  Past Medical History:  Diagnosis Date  . Polycystic kidney disease     History reviewed. No pertinent surgical history.  No Known Allergies  Prior to Admission medications   Medication Sig Start Date End Date Taking? Authorizing Provider  acetaminophen (TYLENOL) 500 MG tablet Take 1,000 mg by mouth every 6 (six) hours as needed for mild pain or moderate pain.   Yes [provider]  Cyanocobalamin (B-12 PO) Take 1 tablet by mouth daily.   Yes [provider]    Social History   Socioeconomic History  . Marital status: Married    Spouse name: Not on file  . Number of children: Not on file  . Years of education: Not on file  . Highest education level: Not on file  Occupational History  . Not on file  Social Needs  . Financial resource strain: Not on file  . Food insecurity    Worry: Not on file    Inability: Not on file  . Transportation needs    Medical: Not on file    Non-medical: Not on file  Tobacco Use  . Smoking status: Never Smoker  . Smokeless tobacco: Never Used  Substance and Sexual Activity  . Alcohol use: Not Currently  . Drug use: Never   . Sexual activity: Not Currently  Lifestyle  . Physical activity    Days per week: Not on file    Minutes per session: Not on file  . Stress: Not on file  Relationships  . Social Herbalist on phone: Not on file    Gets together: Not on file    Attends religious service: Not on file    Active member of club or organization: Not on file    Attends meetings of clubs or organizations: Not on file    Relationship status: Not on file  . Intimate partner violence    Fear of current or ex partner: Not on file    Emotionally abused: Not on file    Physically abused: Not on file    Forced sexual activity: Not on file  Other Topics Concern  . Not on file  Social History Narrative  . Not on file     Family History  Problem Relation Age of Onset  . Renal Disease Mother        gets iHD    ROS: [x]  Positive   [ ]  Negative   [ ]  All sytems  reviewed and are negative  Cardiac: []  chest pain/pressure []  palpitations []  SOB lying flat []  DOE  Vascular: []  pain in legs while walking []  swelling in legs  Pulmonary: []  asthma []  SOB  Neurologic: []  hx of CVA   Hematologic: []  hx of cancer [x]  anemia  Endocrine:   []  diabetes []  thyroid disease  GI [x]  nausea/vomiting []  blood in stool  GU: [x]  CKD/renal failure [x]  HD--[]  M/W/F or []  T/T/S [x]  hx polycystic kidney dz []  blood in urine  Psychiatric: []  anxiety []  depression  Musculoskeletal: []  arthritis []  joint pain  Integumentary: []  rashes []  ulcers  Constitutional: []  fever []  chills [x]  itching [x]  tiredness    Physical Examination  Vitals:   05/10/19 1055 05/11/19 0601  BP: 112/70 116/74  Pulse: 93 94  Resp: 20 17  Temp: 97.8 F (36.6 C) 98.8 F (37.1 C)  SpO2: 98% 100%   Body mass index is 27.55 kg/m.  General:  WDWN in NAD Gait: Not observed HENT: WNL, normocephalic Pulmonary: normal non-labored breathing Cardiac: regular, without  Murmurs without carotid bruit on  left (temp cath on right) Abdomen:  soft, NT/ND, no masses Skin: without rashes Vascular Exam/Pulses:  Right Left  Radial 2+ (normal) 2+ (normal)  Ulnar 2+ (normal) Unable to palpate    Extremities: without ischemic changes, without Gangrene , without cellulitis; without open wounds;  Musculoskeletal: no muscle wasting or atrophy  Neurologic: A&O X 3;  No focal weakness or paresthesias are detected; speech is fluent/normal Psychiatric:  The pt has flat affect.   CBC    Component Value Date/Time   WBC 10.4 05/10/2019 0446   RBC 2.49 (L) 05/10/2019 0446   HGB 7.4 (L) 05/10/2019 0446   HCT 23.1 (L) 05/10/2019 0446   PLT 198 05/10/2019 0446   MCV 92.8 05/10/2019 0446   MCH 29.7 05/10/2019 0446   MCHC 32.0 05/10/2019 0446   RDW 14.5 05/10/2019 0446   LYMPHSABS 0.7 05/10/2019 0446   MONOABS 1.0 05/10/2019 0446   EOSABS 0.8 (H) 05/10/2019 0446   BASOSABS 0.0 05/10/2019 0446    BMET    Component Value Date/Time   NA 138 05/11/2019 0419   K 2.9 (L) 05/11/2019 0419   CL 103 05/11/2019 0419   CO2 21 (L) 05/11/2019 0419   GLUCOSE 105 (H) 05/11/2019 0419   BUN 49 (H) 05/11/2019 0419   CREATININE 8.00 (H) 05/11/2019 0419   CALCIUM 6.1 (LL) 05/11/2019 0419   GFRNONAA 7 (L) 05/11/2019 0419   GFRAA 8 (L) 05/11/2019 0419    COAGS: Lab Results  Component Value Date   INR 1.4 (H) 05/08/2019     Non-Invasive Vascular Imaging:   BUE vein mapping ordered and pending   ASSESSMENT/PLAN: This is a 53 y.o. male with ESRD now on HD in need of TDC and permanent dialysis access.  -pt is right hand dominant.  BUE vein mapping has been ordered.  Discussed fistula vs graft as well as sx of steal syndrome with pt.  Will formulate plan once vein mapping is complete.   -On call MD, Dr. Oneida Alar to see pt later today.   Leontine Locket, PA-C [facility] [phone]   History and exam findings as above.  2+ radial brachial pulse.  No obvious surface veins.  Vein map  pending.  Catheter and permanent access later this week.  Most likely Dr Donnetta Hutching on Thursday  Ruta Hinds, MD [facility] [place] Office: [phone] Pager: [phone]

## 2019-05-11 NOTE — Progress Notes (Signed)
Patient ID: Bernard Jordan, male   DOB: 1965/12/10, 53 y.o.   MRN: [id number] S: No complaints or issues overnight O:BP (P) 102/68   Pulse (P) 89   Temp (!) 97.5 F (36.4 C) (Oral)   Resp 18   Ht 5\' 11"  (1.803 m)   Wt 91.5 kg   SpO2 98%   BMI 28.13 kg/m  No intake or output data in the 24 hours ending 05/11/19 1331 Intake/Output: I/O last 3 completed shifts: In: 321.4 [P.O.:240; I.V.:81.4] Out: 785 [Urine:1100]  Intake/Output this shift:  No intake/output data recorded. Weight change:  Gen:NAD CVS: no rub Resp:cta Abd: benign Ext: no edema  Recent Labs  Lab 05/08/19 1320 05/08/19 2011 05/09/19 0400 05/10/19 0446 05/11/19 0419  NA 139 138 139 139 138  K 3.2* 2.9* 2.3* 2.8* 2.9*  CL 98 101 100 101 103  CO2 9* 9* 16* 17* 21*  GLUCOSE 138* 195* 96 105* 105*  BUN 178* 168* 108* 111* 49*  CREATININE 25.65* 22.63* 15.06* 15.06* 8.00*  ALBUMIN 3.4* 3.1* 2.9* 2.6* 2.4*  CALCIUM 4.1* 4.1* 5.2* 5.4* 6.1*  PHOS  --  >30.0* 4.6 5.2* 2.1*  AST 32  --   --  32  --   ALT 43  --   --  33  --    Liver Function Tests: Recent Labs  Lab 05/08/19 1320  05/09/19 0400 05/10/19 0446 05/11/19 0419  AST 32  --   --  32  --   ALT 43  --   --  33  --   ALKPHOS 56  --   --  41  --   BILITOT 1.0  --   --  0.7  --   PROT 8.7*  --   --  6.4*  --   ALBUMIN 3.4*   < > 2.9* 2.6* 2.4*   < > = values in this interval not displayed.   Recent Labs  Lab 05/08/19 1320  LIPASE 102*   No results for input(s): AMMONIA in the last 168 hours. CBC: Recent Labs  Lab 05/08/19 1320 05/09/19 0400 05/10/19 0446  WBC 14.5* 13.6* 10.4  NEUTROABS  --   --  7.7  HGB 9.5* 8.0* 7.4*  HCT 28.6* 23.2* 23.1*  MCV 91.4 88.2 92.8  PLT 280 217 198   Cardiac Enzymes: No results for input(s): CKTOTAL, CKMB, CKMBINDEX, TROPONINI in the last 168 hours. CBG: No results for input(s): GLUCAP in the last 168 hours.  Iron Studies:  Recent Labs    05/08/19 2011  IRON 113  TIBC 130*  FERRITIN 711*    Studies/Results: Vas [place] Upper Ext Vein Mapping (pre-op Avf)  Result Date: 05/11/2019 UPPER EXTREMITY VEIN MAPPING  Indications: Pre-access. History: Polycystic kidney disease.  Performing Technologist: Oda Cogan RDMS, RVT  Examination Guidelines: A complete evaluation includes B-mode imaging, spectral Doppler, color Doppler, and power Doppler as needed of all accessible portions of each vessel. Bilateral testing is considered an integral part of a complete examination. Limited examinations for reoccurring indications may be performed as noted. +-----------------+-------------+----------+--------------+ Right Cephalic   Diameter (cm)Depth (cm)   Findings    +-----------------+-------------+----------+--------------+ Shoulder             0.28        1.00                  +-----------------+-------------+----------+--------------+ Prox upper arm       0.29        0.92                  +-----------------+-------------+----------+--------------+  Mid upper arm        0.20        0.28                  +-----------------+-------------+----------+--------------+ Dist upper arm       0.19        0.23                  +-----------------+-------------+----------+--------------+ Antecubital fossa    0.22        0.13                  +-----------------+-------------+----------+--------------+ Prox forearm         0.16        0.17                  +-----------------+-------------+----------+--------------+ Mid forearm          0.18        0.17                  +-----------------+-------------+----------+--------------+ Dist forearm         0.13        0.17                  +-----------------+-------------+----------+--------------+ Wrist                                   not visualized +-----------------+-------------+----------+--------------+ +-----------------+-------------+----------+--------------+ Right Basilic    Diameter (cm)Depth (cm)   Findings     +-----------------+-------------+----------+--------------+ Prox upper arm       0.55                              +-----------------+-------------+----------+--------------+ Mid upper arm        0.48                              +-----------------+-------------+----------+--------------+ Dist upper arm       0.35                 branching    +-----------------+-------------+----------+--------------+ Antecubital fossa    0.22                              +-----------------+-------------+----------+--------------+ Prox forearm         0.15                              +-----------------+-------------+----------+--------------+ Mid forearm          0.14                              +-----------------+-------------+----------+--------------+ Distal forearm                          not visualized +-----------------+-------------+----------+--------------+ Elbow                                   not visualized +-----------------+-------------+----------+--------------+ Wrist  not visualized +-----------------+-------------+----------+--------------+ +-----------------+-------------+----------+----------+ Left Cephalic    Diameter (cm)Depth (cm) Findings  +-----------------+-------------+----------+----------+ Shoulder             0.28        1.00              +-----------------+-------------+----------+----------+ Prox upper arm       0.17        0.59              +-----------------+-------------+----------+----------+ Mid upper arm        0.31        0.21   Thrombosed +-----------------+-------------+----------+----------+ Dist upper arm       0.38        0.13   Thrombosed +-----------------+-------------+----------+----------+ Antecubital fossa    0.27        0.16   Thrombosed +-----------------+-------------+----------+----------+ Prox forearm         0.27        0.19   Thrombosed  +-----------------+-------------+----------+----------+ Mid forearm          0.29        0.24   Thrombosed +-----------------+-------------+----------+----------+ Dist forearm         0.17        0.17   Thrombosed +-----------------+-------------+----------+----------+ Wrist                0.16                          +-----------------+-------------+----------+----------+ +-----------------+-------------+----------+------------------------+ Left Basilic     Diameter (cm)Depth (cm)        Findings         +-----------------+-------------+----------+------------------------+ Prox upper arm       0.41                                        +-----------------+-------------+----------+------------------------+ Mid upper arm        0.32                                        +-----------------+-------------+----------+------------------------+ Dist upper arm       0.32               branching and Thrombosed +-----------------+-------------+----------+------------------------+ Antecubital fossa    0.32                      Thrombosed        +-----------------+-------------+----------+------------------------+ Prox forearm         0.20                                        +-----------------+-------------+----------+------------------------+ Mid forearm                                  not visualized      +-----------------+-------------+----------+------------------------+ Distal forearm                               not visualized      +-----------------+-------------+----------+------------------------+ Elbow  not visualized      +-----------------+-------------+----------+------------------------+ *See table(s) above for measurements and observations.   Diagnosing physician:    Preliminary    . calcitRIOL  1 mcg Oral BID  . calcium carbonate  2 tablet Oral TID  . Chlorhexidine Gluconate Cloth  6 each Topical Q0600   . darbepoetin (ARANESP) injection - DIALYSIS  100 mcg Intravenous Q Sat-HD  . docusate sodium  100 mg Oral BID  . feeding supplement (NEPRO CARB STEADY)  237 mL Oral BID BM  . multivitamin  1 tablet Oral QHS  . potassium chloride  40 mEq Oral Once    BMET    Component Value Date/Time   NA 138 05/11/2019 0419   K 2.9 (L) 05/11/2019 0419   CL 103 05/11/2019 0419   CO2 21 (L) 05/11/2019 0419   GLUCOSE 105 (H) 05/11/2019 0419   BUN 49 (H) 05/11/2019 0419   CREATININE 8.00 (H) 05/11/2019 0419   CALCIUM 6.1 (LL) 05/11/2019 0419   GFRNONAA 7 (L) 05/11/2019 0419   GFRAA 8 (L) 05/11/2019 0419   CBC    Component Value Date/Time   WBC 10.4 05/10/2019 0446   RBC 2.49 (L) 05/10/2019 0446   HGB 7.4 (L) 05/10/2019 0446   HCT 23.1 (L) 05/10/2019 0446   PLT 198 05/10/2019 0446   MCV 92.8 05/10/2019 0446   MCH 29.7 05/10/2019 0446   MCHC 32.0 05/10/2019 0446   RDW 14.5 05/10/2019 0446   LYMPHSABS 0.7 05/10/2019 0446   MONOABS 1.0 05/10/2019 0446   EOSABS 0.8 (H) 05/10/2019 0446   BASOSABS 0.0 05/10/2019 0446     Assessment/Plan:  1. 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  He is tolerating it well 2. HTN/Volume- stable 3. Anemia- stable cont with ESA 4. SHPTH- low phos.  No binders.  Cont with calcitriol 5. Hypokalemia- replete and use added K bath 6. Vascular access- have consulted [facility] for AVF/AVG and tunneled HD catheter placement. 7. Disposition- will need placement for outpatient HD  Donetta Potts, MD [facility] [phone]

## 2019-05-11 NOTE — Progress Notes (Signed)
PROGRESS NOTE    Bernard Jordan  [id number] DOB: 07-Mar-1966 DOA: 05/08/2019 PCP: Patient, No Pcp Per    Brief Narrative:  Bernard Jordan  is a 53 y.o. male, with past medical history of polycystic kidney disease, chronic kidney disease most recent creatinine was around 6 range 2 years ago but apparently patient does not follow with any physicians. Patient presented to ED secondary to complaints of nausea and vomiting, reportedly he had a seafood and steak about a week ago, has been feeling nauseated since, with poor oral intake, unable to eat or drink without nausea, as well he did report itching, lethargy, and not feeling well, he denied chest pain, shortness of breath, fever, chills but denied dysuria or polyuria. - in ED patient noted to have creatinine of 25, BUN of 78, bicarb of 9, calcium of 4.9, mild leukocytosis at 14.5, lipase of 102. Admitted under [facility] with nephrology consultation and started on HD with right IJ catheter.  Assessment & Plan:   Active Problems:   CKD (chronic kidney disease), stage IV (HCC)   AKI (acute kidney injury) ([place])   Acute renal failure (HCC)   Hypokalemia   Hypomagnesemia   Hypocalcemia   Anemia of chronic disease  ESRD with progressive chronic kidney disease due to polycystic kidney disease: Presented with acute renal failure and need for urgent hemodialysis. Admitted and received a temporary catheter and underwent hemodialysis on 05/08/2019.  Clinically improving.  Seen and followed by nephrology.  Patient will have subsequent dialysis. IR consulted for tunnel catheter today.  Hypocalcemia: Replaced yesterday.  Further replacement or management deferred to nephrology now that he is on dialysis.  Hypomagnesemia: Replaced yesterday.  Will recheck today.  Hypokalemia: 2.9.  Will replace some orally.  Further management per nephrology now that he is on dialysis.    Anemia of chronic disease: Presented with hemoglobin of over 9 but hemoglobin dropped to  7.4 yesterday.  No labs today.  Will order one.  Transfuse if less than 7.  DVT prophylaxis: Heparin subcu Code Status: Full code Family Communication: None Disposition Plan: Potential discharge in next 1 to 2 days when cleared by nephrology.  Consultants:   Nephrology  Procedures:   Hemodialysis  Antimicrobials:   None   Subjective: Patient seen and examined.  He has no complaint.  Doing well.  Objective: Vitals:   05/10/19 1000 05/10/19 1030 05/10/19 1055 05/11/19 0601  BP: (!) 85/55 107/82 112/70 116/74  Pulse: (!) 118 (!) 109 93 94  Resp:   20 17  Temp:   97.8 F (36.6 C) 98.8 F (37.1 C)  TempSrc:   Oral   SpO2:   98% 100%  Weight:   89.6 kg   Height:        Intake/Output Summary (Last 24 hours) at 05/11/2019 1222 Last data filed at 05/10/2019 1300 Gross per 24 hour  Intake 321.35 ml  Output 150 ml  Net 171.35 ml   Filed Weights   05/08/19 1205 05/10/19 0745 05/10/19 1055  Weight: 99.8 kg 89.4 kg 89.6 kg    Examination:  General exam: Appears calm and comfortable, has right IJ catheter Respiratory system: Clear to auscultation. Respiratory effort normal. Cardiovascular system: S1 & S2 heard, RRR. No JVD, murmurs, rubs, gallops or clicks. No pedal edema. Gastrointestinal system: Abdomen is nondistended, soft and nontender. No organomegaly or masses felt. Normal bowel sounds heard. Central nervous system: Alert and oriented. No focal neurological deficits. Extremities: Symmetric 5 x 5 power. Skin: No rashes,  lesions or ulcers Psychiatry: Judgement and insight appear poor. Mood & affect appropriate.    Data Reviewed: I have personally reviewed following labs and imaging studies  CBC: Recent Labs  Lab 05/08/19 1320 05/09/19 0400 05/10/19 0446  WBC 14.5* 13.6* 10.4  NEUTROABS  --   --  7.7  HGB 9.5* 8.0* 7.4*  HCT 28.6* 23.2* 23.1*  MCV 91.4 88.2 92.8  PLT 280 217 [id number]   Basic Metabolic Panel: Recent Labs  Lab 05/08/19 1320 05/08/19 2011  05/09/19 0400 05/10/19 0446 05/11/19 0419  NA 139 138 139 139 138  K 3.2* 2.9* 2.3* 2.8* 2.9*  CL 98 101 100 101 103  CO2 9* 9* 16* 17* 21*  GLUCOSE 138* 195* 96 105* 105*  BUN 178* 168* 108* 111* 49*  CREATININE 25.65* 22.63* 15.06* 15.06* 8.00*  CALCIUM 4.1* 4.1* 5.2* 5.4* 6.1*  MG  --   --   --  1.5*  --   PHOS  --  >30.0* 4.6 5.2* 2.1*   GFR: Estimated Creatinine Clearance: 11.4 mL/min (A) (by C-G formula based on SCr of 8 mg/dL (H)). Liver Function Tests: Recent Labs  Lab 05/08/19 1320 05/08/19 2011 05/09/19 0400 05/10/19 0446 05/11/19 0419  AST 32  --   --  32  --   ALT 43  --   --  33  --   ALKPHOS 56  --   --  41  --   BILITOT 1.0  --   --  0.7  --   PROT 8.7*  --   --  6.4*  --   ALBUMIN 3.4* 3.1* 2.9* 2.6* 2.4*   Recent Labs  Lab 05/08/19 1320  LIPASE 102*   No results for input(s): AMMONIA in the last 168 hours. Coagulation Profile: Recent Labs  Lab 05/08/19 2011  INR 1.4*   Cardiac Enzymes: No results for input(s): CKTOTAL, CKMB, CKMBINDEX, TROPONINI in the last 168 hours. BNP (last 3 results) No results for input(s): PROBNP in the last 8760 hours. HbA1C: No results for input(s): HGBA1C in the last 72 hours. CBG: No results for input(s): GLUCAP in the last 168 hours. Lipid Profile: No results for input(s): CHOL, HDL, LDLCALC, TRIG, CHOLHDL, LDLDIRECT in the last 72 hours. Thyroid Function Tests: No results for input(s): TSH, T4TOTAL, FREET4, T3FREE, THYROIDAB in the last 72 hours. Anemia Panel: Recent Labs    05/08/19 2011  FERRITIN 711*  TIBC 130*  IRON 113   Sepsis Labs: No results for input(s): PROCALCITON, LATICACIDVEN in the last 168 hours.  Recent Results (from the past 240 hour(s))  SARS CORONAVIRUS 2 Nasal Swab Aptima Multi Swab     Status: None   Collection Time: 05/08/19  2:36 PM   Specimen: Aptima Multi Swab; Nasal Swab  Result Value Ref Range Status   SARS Coronavirus 2 NEGATIVE NEGATIVE Final    Comment:  (NOTE) SARS-CoV-2 target nucleic acids are NOT DETECTED. The SARS-CoV-2 RNA is generally detectable in upper and lower respiratory specimens during the acute phase of infection. Negative results do not preclude SARS-CoV-2 infection, do not rule out co-infections with other pathogens, and should not be used as the sole basis for treatment or other patient management decisions. Negative results must be combined with clinical observations, patient history, and epidemiological information. The expected result is Negative. Fact Sheet for Patients: SugarRoll.be Fact Sheet for Healthcare Providers: https://www.woods-mathews.com/ This test is not yet approved or cleared by the [place] FDA and  has been authorized for detection and/or  diagnosis of SARS-CoV-2 by FDA under an Emergency Use Authorization (EUA). This EUA will remain  in effect (meaning this test can be used) for the duration of the COVID-19 declaration under Section 56 4(b)(1) of the Act, 21 U.S.C. section 360bbb-3(b)(1), unless the authorization is terminated or revoked sooner. Performed at [facility], [place] [address], [place], [place] [address]   SARS Coronavirus 2 [facility] order, Performed in [facility] hospital lab) Nasopharyngeal Nasopharyngeal Swab     Status: None   Collection Time: 05/08/19  5:57 PM   Specimen: Nasopharyngeal Swab  Result Value Ref Range Status   SARS Coronavirus 2 NEGATIVE NEGATIVE Final    Comment: (NOTE) If result is NEGATIVE SARS-CoV-2 target nucleic acids are NOT DETECTED. The SARS-CoV-2 RNA is generally detectable in upper and lower  respiratory specimens during the acute phase of infection. The lowest  concentration of SARS-CoV-2 viral copies this assay can detect is 250  copies / mL. A negative result does not preclude SARS-CoV-2 infection  and should not be used as the sole basis for treatment or other  patient management decisions.  A  negative result may occur with  improper specimen collection / handling, submission of specimen other  than nasopharyngeal swab, presence of viral mutation(s) within the  areas targeted by this assay, and inadequate number of viral copies  (<250 copies / mL). A negative result must be combined with clinical  observations, patient history, and epidemiological information. If result is POSITIVE SARS-CoV-2 target nucleic acids are DETECTED. The SARS-CoV-2 RNA is generally detectable in upper and lower  respiratory specimens dur ing the acute phase of infection.  Positive  results are indicative of active infection with SARS-CoV-2.  Clinical  correlation with patient history and other diagnostic information is  necessary to determine patient infection status.  Positive results do  not rule out bacterial infection or co-infection with other viruses. If result is PRESUMPTIVE POSTIVE SARS-CoV-2 nucleic acids MAY BE PRESENT.   A presumptive positive result was obtained on the submitted specimen  and confirmed on repeat testing.  While 2019 novel coronavirus  (SARS-CoV-2) nucleic acids may be present in the submitted sample  additional confirmatory testing may be necessary for epidemiological  and / or clinical management purposes  to differentiate between  SARS-CoV-2 and other Sarbecovirus currently known to infect humans.  If clinically indicated additional testing with an alternate test  methodology [phone]) is advised. The SARS-CoV-2 RNA is generally  detectable in upper and lower respiratory sp ecimens during the acute  phase of infection. The expected result is Negative. Fact Sheet for Patients:  StrictlyIdeas.no Fact Sheet for Healthcare Providers: BankingDealers.co.za This test is not yet approved or cleared by the [place] FDA and has been authorized for detection and/or diagnosis of SARS-CoV-2 by FDA under an Emergency Use  Authorization (EUA).  This EUA will remain in effect (meaning this test can be used) for the duration of the COVID-19 declaration under Section 564(b)(1) of the Act, 21 U.S.C. section 360bbb-3(b)(1), unless the authorization is terminated or revoked sooner. Performed at [facility], [place] [address], [place],  [address]   MRSA PCR Screening     Status: None   Collection Time: 05/08/19  9:03 PM   Specimen: Nasopharyngeal  Result Value Ref Range Status   MRSA by PCR NEGATIVE NEGATIVE Final    Comment:        The GeneXpert MRSA Assay (FDA approved for NASAL specimens only), is one component of a  comprehensive MRSA colonization surveillance program. It is not intended to diagnose MRSA infection nor to guide or monitor treatment for MRSA infections. Performed at [facility], [place] [address] [place], [place] [address]      Radiology Studies: Vas [place] Upper Ext Vein Mapping (pre-op Avf)  Result Date: 05/11/2019 UPPER EXTREMITY VEIN MAPPING  Indications: Pre-access. History: Polycystic kidney disease.  Performing Technologist: Oda Cogan RDMS, RVT  Examination Guidelines: A complete evaluation includes B-mode imaging, spectral Doppler, color Doppler, and power Doppler as needed of all accessible portions of each vessel. Bilateral testing is considered an integral part of a complete examination. Limited examinations for reoccurring indications may be performed as noted. +-----------------+-------------+----------+--------------+  Right Cephalic    Diameter (cm) Depth (cm)    Findings     +-----------------+-------------+----------+--------------+  Shoulder              0.28         1.00                    +-----------------+-------------+----------+--------------+  Prox upper arm        0.29         0.92                    +-----------------+-------------+----------+--------------+  Mid upper arm         0.20         0.28                     +-----------------+-------------+----------+--------------+  Dist upper arm        0.19         0.23                    +-----------------+-------------+----------+--------------+  Antecubital fossa     0.22         0.13                    +-----------------+-------------+----------+--------------+  Prox forearm          0.16         0.17                    +-----------------+-------------+----------+--------------+  Mid forearm           0.18         0.17                    +-----------------+-------------+----------+--------------+  Dist forearm          0.13         0.17                    +-----------------+-------------+----------+--------------+  Wrist                                      not visualized  +-----------------+-------------+----------+--------------+ +-----------------+-------------+----------+--------------+  Right Basilic     Diameter (cm) Depth (cm)    Findings     +-----------------+-------------+----------+--------------+  Prox upper arm        0.55                                 +-----------------+-------------+----------+--------------+  Mid upper arm         0.48                                 +-----------------+-------------+----------+--------------+  Dist upper arm        0.35                   branching     +-----------------+-------------+----------+--------------+  Antecubital fossa     0.22                                 +-----------------+-------------+----------+--------------+  Prox forearm          0.15                                 +-----------------+-------------+----------+--------------+  Mid forearm           0.14                                 +-----------------+-------------+----------+--------------+  Distal forearm                             not visualized  +-----------------+-------------+----------+--------------+  Elbow                                      not visualized  +-----------------+-------------+----------+--------------+  Wrist                                       not visualized  +-----------------+-------------+----------+--------------+ +-----------------+-------------+----------+----------+  Left Cephalic     Diameter (cm) Depth (cm)  Findings   +-----------------+-------------+----------+----------+  Shoulder              0.28         1.00                +-----------------+-------------+----------+----------+  Prox upper arm        0.17         0.59                +-----------------+-------------+----------+----------+  Mid upper arm         0.31         0.21    Thrombosed  +-----------------+-------------+----------+----------+  Dist upper arm        0.38         0.13    Thrombosed  +-----------------+-------------+----------+----------+  Antecubital fossa     0.27         0.16    Thrombosed  +-----------------+-------------+----------+----------+  Prox forearm          0.27         0.19    Thrombosed  +-----------------+-------------+----------+----------+  Mid forearm           0.29         0.24    Thrombosed  +-----------------+-------------+----------+----------+  Dist forearm          0.17         0.17    Thrombosed  +-----------------+-------------+----------+----------+  Wrist                 0.16                             +-----------------+-------------+----------+----------+ +-----------------+-------------+----------+------------------------+  Left  Basilic      Diameter (cm) Depth (cm)         Findings          +-----------------+-------------+----------+------------------------+  Prox upper arm        0.41                                           +-----------------+-------------+----------+------------------------+  Mid upper arm         0.32                                           +-----------------+-------------+----------+------------------------+  Dist upper arm        0.32                 branching and Thrombosed  +-----------------+-------------+----------+------------------------+  Antecubital fossa     0.32                         Thrombosed         +-----------------+-------------+----------+------------------------+  Prox forearm          0.20                                           +-----------------+-------------+----------+------------------------+  Mid forearm                                     not visualized       +-----------------+-------------+----------+------------------------+  Distal forearm                                  not visualized       +-----------------+-------------+----------+------------------------+  Elbow                                           not visualized       +-----------------+-------------+----------+------------------------+ *See table(s) above for measurements and observations.   Diagnosing physician:    Preliminary         Scheduled Meds:  calcitRIOL  1 mcg Oral BID   calcium carbonate  2 tablet Oral TID   Chlorhexidine Gluconate Cloth  6 each Topical Q0600   darbepoetin (ARANESP) injection - DIALYSIS  100 mcg Intravenous Q Sat-HD   docusate sodium  100 mg Oral BID   feeding supplement (NEPRO CARB STEADY)  237 mL Oral BID BM   multivitamin  1 tablet Oral QHS   Continuous Infusions:  sodium chloride 100 mL/hr at 05/10/19 2306     LOS: 3 days    Time spent: 28 minutes    Darliss Cheney, MD [facility] Pager [phone]  If 7PM-7AM, please contact night-coverage [email or web address] Password [email or web address] 05/11/2019, 12:22 PM

## 2019-05-11 NOTE — Progress Notes (Signed)
Vein mapping  has been completed. Refer to [facility] under chart review to view preliminary results.   05/11/2019  1:02 PM Dajia Gunnels, Bonnye Fava

## 2019-05-11 NOTE — Consult Note (Addendum)
Hospital Consult    Reason for Consult:  In need of HD and [facility] Requesting Physician:  Moshe Cipro MRN #:  [id number]  History of Present Illness: This is a 53 y.o. male who was admitted a couple of days ago with nausea and vomiting.  He had eaten some steak and seafood ~ a week ago and has felt nauseated since then.  When he reported to the ED, he was found to have a creatinine of 25 and BUN of 178.  A temporary catheter was placed and he underwent urgent HD.  He states since HD, his N/V has improved.    He is right hand dominant.    He denies any hx of heart disease or stroke.    The pt is not on a statin for cholesterol management.  The pt is not on a daily aspirin.   Other AC:  Sq heparin for DVT prophylaxis The pt is not on meds for hypertension.   The pt is not diabetic.   Tobacco hx:  never  Past Medical History:  Diagnosis Date  . Polycystic kidney disease     History reviewed. No pertinent surgical history.  No Known Allergies  Prior to Admission medications   Medication Sig Start Date End Date Taking? Authorizing Provider  acetaminophen (TYLENOL) 500 MG tablet Take 1,000 mg by mouth every 6 (six) hours as needed for mild pain or moderate pain.   Yes [provider]  Cyanocobalamin (B-12 PO) Take 1 tablet by mouth daily.   Yes [provider]    Social History   Socioeconomic History  . Marital status: Married    Spouse name: Not on file  . Number of children: Not on file  . Years of education: Not on file  . Highest education level: Not on file  Occupational History  . Not on file  Social Needs  . Financial resource strain: Not on file  . Food insecurity    Worry: Not on file    Inability: Not on file  . Transportation needs    Medical: Not on file    Non-medical: Not on file  Tobacco Use  . Smoking status: Never Smoker  . Smokeless tobacco: Never Used  Substance and Sexual Activity  . Alcohol use: Not Currently  . Drug use: Never   . Sexual activity: Not Currently  Lifestyle  . Physical activity    Days per week: Not on file    Minutes per session: Not on file  . Stress: Not on file  Relationships  . Social Herbalist on phone: Not on file    Gets together: Not on file    Attends religious service: Not on file    Active member of club or organization: Not on file    Attends meetings of clubs or organizations: Not on file    Relationship status: Not on file  . Intimate partner violence    Fear of current or ex partner: Not on file    Emotionally abused: Not on file    Physically abused: Not on file    Forced sexual activity: Not on file  Other Topics Concern  . Not on file  Social History Narrative  . Not on file     Family History  Problem Relation Age of Onset  . Renal Disease Mother        gets iHD    ROS: [x]  Positive   [ ]  Negative   [ ]  All sytems  reviewed and are negative  Cardiac: []  chest pain/pressure []  palpitations []  SOB lying flat []  DOE  Vascular: []  pain in legs while walking []  swelling in legs  Pulmonary: []  asthma []  SOB  Neurologic: []  hx of CVA   Hematologic: []  hx of cancer [x]  anemia  Endocrine:   []  diabetes []  thyroid disease  GI [x]  nausea/vomiting []  blood in stool  GU: [x]  CKD/renal failure [x]  HD--[]  M/W/F or []  T/T/S [x]  hx polycystic kidney dz []  blood in urine  Psychiatric: []  anxiety []  depression  Musculoskeletal: []  arthritis []  joint pain  Integumentary: []  rashes []  ulcers  Constitutional: []  fever []  chills [x]  itching [x]  tiredness    Physical Examination  Vitals:   05/10/19 1055 05/11/19 0601  BP: 112/70 116/74  Pulse: 93 94  Resp: 20 17  Temp: 97.8 F (36.6 C) 98.8 F (37.1 C)  SpO2: 98% 100%   Body mass index is 27.55 kg/m.  General:  WDWN in NAD Gait: Not observed HENT: WNL, normocephalic Pulmonary: normal non-labored breathing Cardiac: regular, without  Murmurs without carotid bruit on  left (temp cath on right) Abdomen:  soft, NT/ND, no masses Skin: without rashes Vascular Exam/Pulses:  Right Left  Radial 2+ (normal) 2+ (normal)  Ulnar 2+ (normal) Unable to palpate    Extremities: without ischemic changes, without Gangrene , without cellulitis; without open wounds;  Musculoskeletal: no muscle wasting or atrophy  Neurologic: A&O X 3;  No focal weakness or paresthesias are detected; speech is fluent/normal Psychiatric:  The pt has flat affect.   CBC    Component Value Date/Time   WBC 10.4 05/10/2019 0446   RBC 2.49 (L) 05/10/2019 0446   HGB 7.4 (L) 05/10/2019 0446   HCT 23.1 (L) 05/10/2019 0446   PLT 198 05/10/2019 0446   MCV 92.8 05/10/2019 0446   MCH 29.7 05/10/2019 0446   MCHC 32.0 05/10/2019 0446   RDW 14.5 05/10/2019 0446   LYMPHSABS 0.7 05/10/2019 0446   MONOABS 1.0 05/10/2019 0446   EOSABS 0.8 (H) 05/10/2019 0446   BASOSABS 0.0 05/10/2019 0446    BMET    Component Value Date/Time   NA 138 05/11/2019 0419   K 2.9 (L) 05/11/2019 0419   CL 103 05/11/2019 0419   CO2 21 (L) 05/11/2019 0419   GLUCOSE 105 (H) 05/11/2019 0419   BUN 49 (H) 05/11/2019 0419   CREATININE 8.00 (H) 05/11/2019 0419   CALCIUM 6.1 (LL) 05/11/2019 0419   GFRNONAA 7 (L) 05/11/2019 0419   GFRAA 8 (L) 05/11/2019 0419    COAGS: Lab Results  Component Value Date   INR 1.4 (H) 05/08/2019     Non-Invasive Vascular Imaging:   BUE vein mapping ordered and pending   ASSESSMENT/PLAN: This is a 53 y.o. male with ESRD now on HD in need of TDC and permanent dialysis access.  -pt is right hand dominant.  BUE vein mapping has been ordered.  Discussed fistula vs graft as well as sx of steal syndrome with pt.  Will formulate plan once vein mapping is complete.   -On call MD, Dr. Oneida Alar to see pt later today.   Leontine Locket, PA-C [facility] [phone]   History and exam findings as above.  2+ radial brachial pulse.  No obvious surface veins.  Vein map  pending.  Catheter and permanent access later this week.  Most likely Dr Donnetta Hutching on Thursday  Ruta Hinds, MD [facility] [place] Office: [phone] Pager: [phone]

## 2019-05-12 LAB — RENAL FUNCTION PANEL
Albumin: 2.3 g/dL — ABNORMAL LOW (ref 3.5–5.0)
Anion gap: 12 (ref 5–15)
BUN: 23 mg/dL — ABNORMAL HIGH (ref 6–20)
CO2: 23 mmol/L (ref 22–32)
Calcium: 6.6 mg/dL — ABNORMAL LOW (ref 8.9–10.3)
Chloride: 102 mmol/L (ref 98–111)
Creatinine, Ser: 4.42 mg/dL — ABNORMAL HIGH (ref 0.61–1.24)
GFR calc Af Amer: 16 mL/min — ABNORMAL LOW (ref >60)
GFR calc non Af Amer: 14 mL/min — ABNORMAL LOW (ref >60)
Glucose, Bld: 95 mg/dL (ref 70–99)
Phosphorus: 1.3 mg/dL — ABNORMAL LOW (ref 2.5–4.6)
Potassium: 3.4 mmol/L — ABNORMAL LOW (ref 3.5–5.1)
Sodium: 137 mmol/L (ref 135–145)

## 2019-05-12 LAB — GLUCOSE, CAPILLARY: Glucose-Capillary: 97 mg/dL (ref 70–99)

## 2019-05-12 NOTE — Progress Notes (Signed)
Patient ID: Bernard Jordan, male   DOB: 02/25/1966, 53 y.o.   MRN: [id number] S: No new complaints O:BP 101/72 (BP Location: Left Arm)   Pulse (!) 104   Temp 98.6 F (37 C)   Resp 16   Ht 5\' 11"  (1.803 m)   Wt 91.9 kg   SpO2 96%   BMI 28.26 kg/m   Intake/Output Summary (Last 24 hours) at 05/12/2019 1257 Last data filed at 05/12/2019 0953 Gross per 24 hour  Intake 400 ml  Output -182 ml  Net 582 ml   Intake/Output: I/O last 3 completed shifts: In: -  Out: -332   Intake/Output this shift:  Total I/O In: 400 [P.O.:400] Out: 150 [Urine:150] Weight change: 2.1 kg Gen: NAD CVS: no rub Resp: cta Abd: benign Ext: no edema  Recent Labs  Lab 05/08/19 1320 05/08/19 2011 05/09/19 0400 05/10/19 0446 05/11/19 0419 05/12/19 0521  NA 139 138 139 139 138 137  K 3.2* 2.9* 2.3* 2.8* 2.9* 3.4*  CL 98 101 100 101 103 102  CO2 9* 9* 16* 17* 21* 23  GLUCOSE 138* 195* 96 105* 105* 95  BUN 178* 168* 108* 111* 49* 23*  CREATININE 25.65* 22.63* 15.06* 15.06* 8.00* 4.42*  ALBUMIN 3.4* 3.1* 2.9* 2.6* 2.4* 2.3*  CALCIUM 4.1* 4.1* 5.2* 5.4* 6.1* 6.6*  PHOS  --  >30.0* 4.6 5.2* 2.1* 1.3*  AST 32  --   --  32  --   --   ALT 43  --   --  33  --   --    Liver Function Tests: Recent Labs  Lab 05/08/19 1320  05/10/19 0446 05/11/19 0419 05/12/19 0521  AST 32  --  32  --   --   ALT 43  --  33  --   --   ALKPHOS 56  --  41  --   --   BILITOT 1.0  --  0.7  --   --   PROT 8.7*  --  6.4*  --   --   ALBUMIN 3.4*   < > 2.6* 2.4* 2.3*   < > = values in this interval not displayed.   Recent Labs  Lab 05/08/19 1320  LIPASE 102*   No results for input(s): AMMONIA in the last 168 hours. CBC: Recent Labs  Lab 05/08/19 1320 05/09/19 0400 05/10/19 0446 05/11/19 1334  WBC 14.5* 13.6* 10.4 9.8  NEUTROABS  --   --  7.7 6.9  HGB 9.5* 8.0* 7.4* 7.4*  HCT 28.6* 23.2* 23.1* 23.2*  MCV 91.4 88.2 92.8 95.9  PLT 280 217 198 191   Cardiac Enzymes: No results for input(s): CKTOTAL, CKMB,  CKMBINDEX, TROPONINI in the last 168 hours. CBG: No results for input(s): GLUCAP in the last 168 hours.  Iron Studies: No results for input(s): IRON, TIBC, TRANSFERRIN, FERRITIN in the last 72 hours. Studies/Results: Vas [place] Upper Ext Vein Mapping (pre-op Avf)  Result Date: 05/11/2019 UPPER EXTREMITY VEIN MAPPING  Indications: Pre-access. History: Polycystic kidney disease.  Performing Technologist: Oda Cogan RDMS, RVT  Examination Guidelines: A complete evaluation includes B-mode imaging, spectral Doppler, color Doppler, and power Doppler as needed of all accessible portions of each vessel. Bilateral testing is considered an integral part of a complete examination. Limited examinations for reoccurring indications may be performed as noted. +-----------------+-------------+----------+--------------+ Right Cephalic   Diameter (cm)Depth (cm)   Findings    +-----------------+-------------+----------+--------------+ Shoulder             0.28  1.00                  +-----------------+-------------+----------+--------------+ Prox upper arm       0.29        0.92                  +-----------------+-------------+----------+--------------+ Mid upper arm        0.20        0.28                  +-----------------+-------------+----------+--------------+ Dist upper arm       0.19        0.23                  +-----------------+-------------+----------+--------------+ Antecubital fossa    0.22        0.13                  +-----------------+-------------+----------+--------------+ Prox forearm         0.16        0.17                  +-----------------+-------------+----------+--------------+ Mid forearm          0.18        0.17                  +-----------------+-------------+----------+--------------+ Dist forearm         0.13        0.17                  +-----------------+-------------+----------+--------------+ Wrist                                    not visualized +-----------------+-------------+----------+--------------+ +-----------------+-------------+----------+--------------+ Right Basilic    Diameter (cm)Depth (cm)   Findings    +-----------------+-------------+----------+--------------+ Prox upper arm       0.55                              +-----------------+-------------+----------+--------------+ Mid upper arm        0.48                              +-----------------+-------------+----------+--------------+ Dist upper arm       0.35                 branching    +-----------------+-------------+----------+--------------+ Antecubital fossa    0.22                              +-----------------+-------------+----------+--------------+ Prox forearm         0.15                              +-----------------+-------------+----------+--------------+ Mid forearm          0.14                              +-----------------+-------------+----------+--------------+ Distal forearm                          not visualized +-----------------+-------------+----------+--------------+ Elbow  not visualized +-----------------+-------------+----------+--------------+ Wrist                                   not visualized +-----------------+-------------+----------+--------------+ +-----------------+-------------+----------+----------+ Left Cephalic    Diameter (cm)Depth (cm) Findings  +-----------------+-------------+----------+----------+ Shoulder             0.28        1.00              +-----------------+-------------+----------+----------+ Prox upper arm       0.17        0.59              +-----------------+-------------+----------+----------+ Mid upper arm        0.31        0.21   Thrombosed +-----------------+-------------+----------+----------+ Dist upper arm       0.38        0.13   Thrombosed  +-----------------+-------------+----------+----------+ Antecubital fossa    0.27        0.16   Thrombosed +-----------------+-------------+----------+----------+ Prox forearm         0.27        0.19   Thrombosed +-----------------+-------------+----------+----------+ Mid forearm          0.29        0.24   Thrombosed +-----------------+-------------+----------+----------+ Dist forearm         0.17        0.17   Thrombosed +-----------------+-------------+----------+----------+ Wrist                0.16                          +-----------------+-------------+----------+----------+ +-----------------+-------------+----------+------------------------+ Left Basilic     Diameter (cm)Depth (cm)        Findings         +-----------------+-------------+----------+------------------------+ Prox upper arm       0.41                                        +-----------------+-------------+----------+------------------------+ Mid upper arm        0.32                                        +-----------------+-------------+----------+------------------------+ Dist upper arm       0.32               branching and Thrombosed +-----------------+-------------+----------+------------------------+ Antecubital fossa    0.32                      Thrombosed        +-----------------+-------------+----------+------------------------+ Prox forearm         0.20                                        +-----------------+-------------+----------+------------------------+ Mid forearm                                  not visualized      +-----------------+-------------+----------+------------------------+ Distal forearm  not visualized      +-----------------+-------------+----------+------------------------+ Elbow                                        not visualized      +-----------------+-------------+----------+------------------------+  *See table(s) above for measurements and observations.   Diagnosing physician:    Preliminary    . calcitRIOL  1 mcg Oral BID  . calcium carbonate  2 tablet Oral TID  . Chlorhexidine Gluconate Cloth  6 each Topical Q0600  . darbepoetin (ARANESP) injection - DIALYSIS  100 mcg Intravenous Q Sat-HD  . docusate sodium  100 mg Oral BID  . feeding supplement (NEPRO CARB STEADY)  237 mL Oral BID BM  . multivitamin  1 tablet Oral QHS    BMET    Component Value Date/Time   NA 137 05/12/2019 0521   K 3.4 (L) 05/12/2019 0521   CL 102 05/12/2019 0521   CO2 23 05/12/2019 0521   GLUCOSE 95 05/12/2019 0521   BUN 23 (H) 05/12/2019 0521   CREATININE 4.42 (H) 05/12/2019 0521   CALCIUM 6.6 (L) 05/12/2019 0521   GFRNONAA 14 (L) 05/12/2019 0521   GFRAA 16 (L) 05/12/2019 0521   CBC    Component Value Date/Time   WBC 9.8 05/11/2019 1334   RBC 2.42 (L) 05/11/2019 1334   HGB 7.4 (L) 05/11/2019 1334   HCT 23.2 (L) 05/11/2019 1334   PLT 191 05/11/2019 1334   MCV 95.9 05/11/2019 1334   MCH 30.6 05/11/2019 1334   MCHC 31.9 05/11/2019 1334   RDW 14.7 05/11/2019 1334   LYMPHSABS 1.0 05/11/2019 1334   MONOABS 1.0 05/11/2019 1334   EOSABS 0.7 (H) 05/11/2019 1334   BASOSABS 0.0 05/11/2019 1334     Assessment/Plan:  1. 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  He is tolerating it well and plan for HD tomorrow. 1. CLIP process underway 2. Awaiting permanent access placement. 2. HTN/Volume- stable 3. Anemia- stable cont with ESA 4. SHPTH- low phos.  No binders.  Cont with calcitriol 5. Hypokalemia- replete and use added K bath 6. Vascular access- have consulted [facility] for AVF/AVG and tunneled HD catheter placement for 05/14/19 7. Disposition- will need placement for outpatient HD  Donetta Potts, MD [facility] [phone]

## 2019-05-12 NOTE — Progress Notes (Signed)
PROGRESS NOTE    Bernard Jordan  [id number] DOB: 28-May-1966 DOA: 05/08/2019 PCP: Patient, No Pcp Per    Brief Narrative:  Bernard Jordan  is a 53 y.o. male, with past medical history of polycystic kidney disease, chronic kidney disease most recent creatinine was around 6 range 2 years ago but apparently patient does not follow with any physicians. Patient presented to ED secondary to complaints of nausea and vomiting, reportedly he had a seafood and steak about a week ago, has been feeling nauseated since, with poor oral intake, unable to eat or drink without nausea, as well he did report itching, lethargy, and not feeling well, he denied chest pain, shortness of breath, fever, chills but denied dysuria or polyuria. - in ED patient noted to have creatinine of 25, BUN of 78, bicarb of 9, calcium of 4.9, mild leukocytosis at 14.5, lipase of 102. Admitted under [facility] with nephrology consultation and started on HD with right IJ catheter.  Patient completed vein mapping on 05/11/2019 and waiting for permanent HD catheter placement.  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 Admitted and received a temporary catheter and underwent hemodialysis on 05/08/2019.  Clinically improving.  Seen and followed by nephrology.  Patient will have subsequent dialysis. IR consulted for tunnel catheter  Hypocalcemia: 6.6.  Will defer to nephrology.  Hypomagnesemia: Resolved.  Hypokalemia: 3.4.  Will defer to nephrology now that he is on dialysis.  Anemia of chronic disease: Presented with hemoglobin of over 9.  Last hemoglobin from yesterday 7.4.  Repeating CBC today.  DVT prophylaxis:  Heparin subcu Code Status: Full code Family Communication: None present Disposition Plan: Potential discharge in next 1 to 2 days when cleared by nephrology.  Consultants:   Nephrology  Procedures:   Hemodialysis  Antimicrobials:   None   Subjective: Patient seen and examined.  He has no complaints.  Objective: Vitals:   05/11/19 1530 05/11/19 1602 05/11/19 2325 05/12/19 0628  BP: 92/71 108/74 94/65 101/72  Pulse: 73 (!) 103 (!) 104 (!) 104  Resp:  18 16 16   Temp:  97.6 F (36.4 C) 98.3 F (36.8 C) 98.6 F (37 C)  TempSrc:  Oral    SpO2:  98% 100% 96%  Weight:  91.9 kg    Height:        Intake/Output Summary (Last 24 hours) at 05/12/2019 1135 Last data filed at 05/12/2019 0953 Gross per 24 hour  Intake --  Output -182 ml  Net 182 ml   Filed Weights   05/10/19 1055 05/11/19 1230 05/11/19 1602  Weight: 89.6 kg 91.5 kg 91.9 kg    Examination:  General exam: Appears calm and comfortable , right IJ catheter Respiratory system: Clear to auscultation. Respiratory effort normal. Cardiovascular system: S1 & S2 heard, RRR. No JVD, murmurs, rubs, gallops or clicks. No pedal edema. Gastrointestinal system: Abdomen is nondistended, soft and nontender. No organomegaly or masses felt. Normal bowel sounds heard. Central nervous system: Alert and oriented. No focal neurological deficits. Extremities: Symmetric 5 x 5 power. Skin: No rashes, lesions or ulcers Psychiatry: Judgement and insight appear poor. Mood & affect appropriate.   Data Reviewed: I have  personally reviewed following labs and imaging studies  CBC: Recent Labs  Lab 05/08/19 1320 05/09/19 0400 05/10/19 0446 05/11/19 1334  WBC 14.5* 13.6* 10.4 9.8  NEUTROABS  --   --  7.7 6.9  HGB 9.5* 8.0* 7.4* 7.4*  HCT 28.6* 23.2* 23.1* 23.2*  MCV 91.4 88.2 92.8 95.9  PLT 280 217 198 [id number]   Basic Metabolic Panel: Recent Labs  Lab 05/08/19 2011 05/09/19 0400 05/10/19 0446 05/11/19 0419 05/11/19 1730  05/12/19 0521  NA 138 139 139 138  --  137  K 2.9* 2.3* 2.8* 2.9*  --  3.4*  CL 101 100 101 103  --  102  CO2 9* 16* 17* 21*  --  23  GLUCOSE 195* 96 105* 105*  --  95  BUN 168* 108* 111* 49*  --  23*  CREATININE 22.63* 15.06* 15.06* 8.00*  --  4.42*  CALCIUM 4.1* 5.2* 5.4* 6.1*  --  6.6*  MG  --   --  1.5*  --  1.7  --   PHOS >30.0* 4.6 5.2* 2.1*  --  1.3*   GFR: Estimated Creatinine Clearance: 22.4 mL/min (A) (by C-G formula based on SCr of 4.42 mg/dL (H)). Liver Function Tests: Recent Labs  Lab 05/08/19 1320 05/08/19 2011 05/09/19 0400 05/10/19 0446 05/11/19 0419 05/12/19 0521  AST 32  --   --  32  --   --   ALT 43  --   --  33  --   --   ALKPHOS 56  --   --  41  --   --   BILITOT 1.0  --   --  0.7  --   --   PROT 8.7*  --   --  6.4*  --   --   ALBUMIN 3.4* 3.1* 2.9* 2.6* 2.4* 2.3*   Recent Labs  Lab 05/08/19 1320  LIPASE 102*   No results for input(s): AMMONIA in the last 168 hours. Coagulation Profile: Recent Labs  Lab 05/08/19 2011  INR 1.4*   Cardiac Enzymes: No results for input(s): CKTOTAL, CKMB, CKMBINDEX, TROPONINI in the last 168 hours. BNP (last 3 results) No results for input(s): PROBNP in the last 8760 hours. HbA1C: No results for input(s): HGBA1C in the last 72 hours. CBG: No results for input(s): GLUCAP in the last 168 hours. Lipid Profile: No results for input(s): CHOL, HDL, LDLCALC, TRIG, CHOLHDL, LDLDIRECT in the last 72 hours. Thyroid Function Tests: No results for input(s): TSH, T4TOTAL, FREET4, T3FREE, THYROIDAB in the last 72 hours. Anemia Panel: No results for input(s): VITAMINB12, FOLATE, FERRITIN, TIBC, IRON, RETICCTPCT in the last 72 hours. Sepsis Labs: No results for input(s): PROCALCITON, LATICACIDVEN in the last 168 hours.  Recent Results (from the past 240 hour(s))  SARS CORONAVIRUS 2 Nasal Swab Aptima Multi Swab     Status: None   Collection Time: 05/08/19  2:36 PM   Specimen: Aptima Multi Swab; Nasal Swab  Result Value  Ref Range Status   SARS Coronavirus 2 NEGATIVE NEGATIVE Final    Comment: (NOTE) SARS-CoV-2 target nucleic acids are NOT DETECTED. The SARS-CoV-2 RNA is generally detectable in upper and lower respiratory specimens during the acute phase of infection. Negative results do not preclude SARS-CoV-2 infection, do not rule out co-infections with other pathogens, and should not be used as the sole basis for treatment or other patient management decisions. Negative results must be combined with clinical observations, patient history, and epidemiological information. The expected result is Negative. Fact  Sheet for Patients: SugarRoll.be Fact Sheet for Healthcare Providers: https://www.woods-mathews.com/ This test is not yet approved or cleared by the [place] FDA and  has been authorized for detection and/or diagnosis of SARS-CoV-2 by FDA under an Emergency Use Authorization (EUA). This EUA will remain  in effect (meaning this test can be used) for the duration of the COVID-19 declaration under Section 56 4(b)(1) of the Act, 21 U.S.C. section 360bbb-3(b)(1), unless the authorization is terminated or revoked sooner. Performed at [facility], [place] [address] [place], [place] [address]   SARS Coronavirus 2 [facility] order, Performed in [facility] hospital lab) Nasopharyngeal Nasopharyngeal Swab     Status: None   Collection Time: 05/08/19  5:57 PM   Specimen: Nasopharyngeal Swab  Result Value Ref Range Status   SARS Coronavirus 2 NEGATIVE NEGATIVE Final    Comment: (NOTE) If result is NEGATIVE SARS-CoV-2 target nucleic acids are NOT DETECTED. The SARS-CoV-2 RNA is generally detectable in upper and lower  respiratory specimens during the acute phase of infection. The lowest  concentration of SARS-CoV-2 viral copies this assay can detect is 250  copies / mL. A negative result does not preclude SARS-CoV-2 infection  and should not be used  as the sole basis for treatment or other  patient management decisions.  A negative result may occur with  improper specimen collection / handling, submission of specimen other  than nasopharyngeal swab, presence of viral mutation(s) within the  areas targeted by this assay, and inadequate number of viral copies  (<250 copies / mL). A negative result must be combined with clinical  observations, patient history, and epidemiological information. If result is POSITIVE SARS-CoV-2 target nucleic acids are DETECTED. The SARS-CoV-2 RNA is generally detectable in upper and lower  respiratory specimens dur ing the acute phase of infection.  Positive  results are indicative of active infection with SARS-CoV-2.  Clinical  correlation with patient history and other diagnostic information is  necessary to determine patient infection status.  Positive results do  not rule out bacterial infection or co-infection with other viruses. If result is PRESUMPTIVE POSTIVE SARS-CoV-2 nucleic acids MAY BE PRESENT.   A presumptive positive result was obtained on the submitted specimen  and confirmed on repeat testing.  While 2019 novel coronavirus  (SARS-CoV-2) nucleic acids may be present in the submitted sample  additional confirmatory testing may be necessary for epidemiological  and / or clinical management purposes  to differentiate between  SARS-CoV-2 and other Sarbecovirus currently known to infect humans.  If clinically indicated additional testing with an alternate test  methodology [phone]) is advised. The SARS-CoV-2 RNA is generally  detectable in upper and lower respiratory sp ecimens during the acute  phase of infection. The expected result is Negative. Fact Sheet for Patients:  StrictlyIdeas.no Fact Sheet for Healthcare Providers: BankingDealers.co.za This test is not yet approved or cleared by the [place] FDA and has been authorized for  detection and/or diagnosis of SARS-CoV-2 by FDA under an Emergency Use Authorization (EUA).  This EUA will remain in effect (meaning this test can be used) for the duration of the COVID-19 declaration under Section 564(b)(1) of the Act, 21 U.S.C. section 360bbb-3(b)(1), unless the authorization is terminated or revoked sooner. Performed at [facility], [place] [address] [place], [place] [address]   MRSA PCR Screening     Status: None   Collection Time: 05/08/19  9:03 PM   Specimen: Nasopharyngeal  Result Value Ref Range Status   MRSA  by PCR NEGATIVE NEGATIVE Final    Comment:        The GeneXpert MRSA Assay (FDA approved for NASAL specimens only), is one component of a comprehensive MRSA colonization surveillance program. It is not intended to diagnose MRSA infection nor to guide or monitor treatment for MRSA infections. Performed at [facility], [place] [address], [place], [place] [address]      Radiology Studies: Vas [place] Upper Ext Vein Mapping (pre-op Avf)  Result Date: 05/11/2019 UPPER EXTREMITY VEIN MAPPING  Indications: Pre-access. History: Polycystic kidney disease.  Performing Technologist: Oda Cogan RDMS, RVT  Examination Guidelines: A complete evaluation includes B-mode imaging, spectral Doppler, color Doppler, and power Doppler as needed of all accessible portions of each vessel. Bilateral testing is considered an integral part of a complete examination. Limited examinations for reoccurring indications may be performed as noted. +-----------------+-------------+----------+--------------+  Right Cephalic    Diameter (cm) Depth (cm)    Findings     +-----------------+-------------+----------+--------------+  Shoulder              0.28         1.00                    +-----------------+-------------+----------+--------------+  Prox upper arm        0.29         0.92                    +-----------------+-------------+----------+--------------+  Mid upper arm          0.20         0.28                    +-----------------+-------------+----------+--------------+  Dist upper arm        0.19         0.23                    +-----------------+-------------+----------+--------------+  Antecubital fossa     0.22         0.13                    +-----------------+-------------+----------+--------------+  Prox forearm          0.16         0.17                    +-----------------+-------------+----------+--------------+  Mid forearm           0.18         0.17                    +-----------------+-------------+----------+--------------+  Dist forearm          0.13         0.17                    +-----------------+-------------+----------+--------------+  Wrist                                      not visualized  +-----------------+-------------+----------+--------------+ +-----------------+-------------+----------+--------------+  Right Basilic     Diameter (cm) Depth (cm)    Findings     +-----------------+-------------+----------+--------------+  Prox upper arm        0.55                                 +-----------------+-------------+----------+--------------+  Mid upper arm         0.48                                 +-----------------+-------------+----------+--------------+  Dist upper arm        0.35                   branching     +-----------------+-------------+----------+--------------+  Antecubital fossa     0.22                                 +-----------------+-------------+----------+--------------+  Prox forearm          0.15                                 +-----------------+-------------+----------+--------------+  Mid forearm           0.14                                 +-----------------+-------------+----------+--------------+  Distal forearm                             not visualized  +-----------------+-------------+----------+--------------+  Elbow                                      not visualized   +-----------------+-------------+----------+--------------+  Wrist                                      not visualized  +-----------------+-------------+----------+--------------+ +-----------------+-------------+----------+----------+  Left Cephalic     Diameter (cm) Depth (cm)  Findings   +-----------------+-------------+----------+----------+  Shoulder              0.28         1.00                +-----------------+-------------+----------+----------+  Prox upper arm        0.17         0.59                +-----------------+-------------+----------+----------+  Mid upper arm         0.31         0.21    Thrombosed  +-----------------+-------------+----------+----------+  Dist upper arm        0.38         0.13    Thrombosed  +-----------------+-------------+----------+----------+  Antecubital fossa     0.27         0.16    Thrombosed  +-----------------+-------------+----------+----------+  Prox forearm          0.27         0.19    Thrombosed  +-----------------+-------------+----------+----------+  Mid forearm           0.29         0.24    Thrombosed  +-----------------+-------------+----------+----------+  Dist forearm          0.17         0.17    Thrombosed  +-----------------+-------------+----------+----------+  Wrist  0.16                             +-----------------+-------------+----------+----------+ +-----------------+-------------+----------+------------------------+  Left Basilic      Diameter (cm) Depth (cm)         Findings          +-----------------+-------------+----------+------------------------+  Prox upper arm        0.41                                           +-----------------+-------------+----------+------------------------+  Mid upper arm         0.32                                           +-----------------+-------------+----------+------------------------+  Dist upper arm        0.32                 branching and Thrombosed   +-----------------+-------------+----------+------------------------+  Antecubital fossa     0.32                        Thrombosed         +-----------------+-------------+----------+------------------------+  Prox forearm          0.20                                           +-----------------+-------------+----------+------------------------+  Mid forearm                                     not visualized       +-----------------+-------------+----------+------------------------+  Distal forearm                                  not visualized       +-----------------+-------------+----------+------------------------+  Elbow                                           not visualized       +-----------------+-------------+----------+------------------------+ *See table(s) above for measurements and observations.   Diagnosing physician:    Preliminary         Scheduled Meds:  calcitRIOL  1 mcg Oral BID   calcium carbonate  2 tablet Oral TID   Chlorhexidine Gluconate Cloth  6 each Topical Q0600   darbepoetin (ARANESP) injection - DIALYSIS  100 mcg Intravenous Q Sat-HD   docusate sodium  100 mg Oral BID   feeding supplement (NEPRO CARB STEADY)  237 mL Oral BID BM   multivitamin  1 tablet Oral QHS   Continuous Infusions:  sodium chloride 100 mL/hr at 05/11/19 2041     LOS: 4 days    Time spent: 26 minutes    Darliss Cheney, MD [facility] Pager [phone]  If 7PM-7AM, please contact night-coverage [email or web address] Password [facility] 05/12/2019, 11:35 AM

## 2019-05-13 LAB — RENAL FUNCTION PANEL
Albumin: 2.3 g/dL — ABNORMAL LOW (ref 3.5–5.0)
Anion gap: 9 (ref 5–15)
BUN: 33 mg/dL — ABNORMAL HIGH (ref 6–20)
CO2: 23 mmol/L (ref 22–32)
Calcium: 6.4 mg/dL — CL (ref 8.9–10.3)
Chloride: 109 mmol/L (ref 98–111)
Creatinine, Ser: 5.69 mg/dL — ABNORMAL HIGH (ref 0.61–1.24)
GFR calc Af Amer: 12 mL/min — ABNORMAL LOW (ref >60)
GFR calc non Af Amer: 10 mL/min — ABNORMAL LOW (ref >60)
Glucose, Bld: 98 mg/dL (ref 70–99)
Phosphorus: 1.1 mg/dL — ABNORMAL LOW (ref 2.5–4.6)
Potassium: 3.5 mmol/L (ref 3.5–5.1)
Sodium: 141 mmol/L (ref 135–145)

## 2019-05-13 LAB — CBC
HCT: 22.7 % — ABNORMAL LOW (ref 39.0–52.0)
Hemoglobin: 7 g/dL — ABNORMAL LOW (ref 13.0–17.0)
MCH: 30.6 pg (ref 26.0–34.0)
MCHC: 30.8 g/dL (ref 30.0–36.0)
MCV: 99.1 fL (ref 80.0–100.0)
Platelets: 207 10*3/uL (ref 150–400)
RBC: 2.29 MIL/uL — ABNORMAL LOW (ref 4.22–5.81)
RDW: 15.1 % (ref 11.5–15.5)
WBC: 12.1 10*3/uL — ABNORMAL HIGH (ref 4.0–10.5)
nRBC: 0.2 % (ref 0.0–0.2)

## 2019-05-13 MED ORDER — HEPARIN SODIUM (PORCINE) 1000 UNIT/ML DIALYSIS: 20.0000 [IU]/kg | INTRAMUSCULAR | Status: DC | PRN

## 2019-05-13 MED ORDER — SODIUM CHLORIDE 0.9 % IV SOLN
1.5000 g | INTRAVENOUS | Status: AC
  Administered 2019-05-14: 1.5 g via INTRAVENOUS
  Filled 2019-05-13: qty 1.5

## 2019-05-13 MED ORDER — POTASSIUM PHOSPHATES 15 MMOLE/5ML IV SOLN
10.0000 mmol | Freq: Once | INTRAVENOUS | Status: AC
  Administered 2019-05-13: 14:00:00 10 mmol via INTRAVENOUS
  Filled 2019-05-13: qty 3.33

## 2019-05-13 MED ORDER — HEPARIN SODIUM (PORCINE) 1000 UNIT/ML IJ SOLN
INTRAMUSCULAR | Status: AC
  Filled 2019-05-13: qty 3

## 2019-05-13 MED ORDER — CALCITRIOL 0.5 MCG PO CAPS
ORAL_CAPSULE | ORAL | Status: AC
  Filled 2019-05-13: qty 2

## 2019-05-13 MED ORDER — CALCITRIOL 0.25 MCG PO CAPS
ORAL_CAPSULE | ORAL | Status: AC
  Filled 2019-05-13: qty 1

## 2019-05-13 NOTE — Procedures (Signed)
I was present at this dialysis session. I have reviewed the session itself and made appropriate changes.   Vital signs in last 24 hours:  Temp:  [97.3 F (36.3 C)-98.6 F (37 C)] 98.2 F (36.8 C) (08/12 0701) Pulse Rate:  [91-101] 91 (08/12 0800) Resp:  [17-20] 18 (08/12 0800) BP: (83-113)/(61-78) 106/73 (08/12 0800) SpO2:  [94 %-100 %] 100 % (08/12 0701) Weight:  [95.1 kg] 95.1 kg (08/12 0701) Weight change: 3.6 kg Filed Weights   05/11/19 1230 05/11/19 1602 05/13/19 0701  Weight: 91.5 kg 91.9 kg 95.1 kg    Recent Labs  Lab 05/13/19 0523  NA 141  K 3.5  CL 109  CO2 23  GLUCOSE 98  BUN 33*  CREATININE 5.69*  CALCIUM 6.4*  PHOS 1.1*    Recent Labs  Lab 05/09/19 0400 05/10/19 0446 05/11/19 1334  WBC 13.6* 10.4 9.8  NEUTROABS  --  7.7 6.9  HGB 8.0* 7.4* 7.4*  HCT 23.2* 23.1* 23.2*  MCV 88.2 92.8 95.9  PLT 217 198 191    Scheduled Meds: . calcitRIOL  1 mcg Oral BID  . calcium carbonate  2 tablet Oral TID  . Chlorhexidine Gluconate Cloth  6 each Topical Q0600  . darbepoetin (ARANESP) injection - DIALYSIS  100 mcg Intravenous Q Sat-HD  . docusate sodium  100 mg Oral BID  . feeding supplement (NEPRO CARB STEADY)  237 mL Oral BID BM  . multivitamin  1 tablet Oral QHS   Continuous Infusions: . sodium chloride 100 mL/hr at 05/13/19 0542   PRN Meds:.acetaminophen **OR** acetaminophen, heparin, [START ON 05/14/2019] pneumococcal 23 valent vaccine, polyethylene glycol, sodium chloride flush     Assessment/Plan:  1. 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 He is tolerating it well and plan for HD tomorrow. 1. CLIP process underway 2. Awaiting permanent access placement. 2. HTN/Volume- stable 3. Anemia- stable cont with ESA 4. SHPTH- low phos. No binders. Cont with calcitriol 5. Hypokalemia- replete and use added K bath 6. Vascular access- have consulted [facility] for AVF/AVG and tunneled HD catheter placement for 05/14/19 7. Disposition-  will need placement for outpatient HD  Donetta Potts,  MD 05/13/2019, 8:27 AM

## 2019-05-13 NOTE — Progress Notes (Signed)
PROGRESS NOTE    Bernard Jordan  [id number] DOB: 1966-09-25 DOA: 05/08/2019 PCP: Patient, No Pcp Per    Brief Narrative:  Bernard Jordan  is a 53 y.o. male, with past medical history of polycystic kidney disease, chronic kidney disease most recent creatinine was around 6 range 2 years ago but apparently patient does not follow with any physicians. Patient presented to ED secondary to complaints of nausea and vomiting, reportedly he had a seafood and steak about a week ago, has been feeling nauseated since, with poor oral intake, unable to eat or drink without nausea, as well he did report itching, lethargy, and not feeling well, he denied chest pain, shortness of breath, fever, chills but denied dysuria or polyuria. - in ED patient noted to have creatinine of 25, BUN of 78, bicarb of 9, calcium of 4.9, mild leukocytosis at 14.5, lipase of 102. Admitted under [facility] with nephrology consultation and started on HD with right IJ catheter.  Patient completed vein mapping on 05/11/2019 and waiting for permanent HD catheter placement.  Assessment & Plan:   Active Problems:   CKD (chronic kidney disease), stage IV (HCC)   AKI (acute kidney injury) ([place])   Acute renal failure (HCC)   Hypokalemia   Hypomagnesemia   Hypocalcemia   Anemia of chronic disease  ESRD with progressive chronic kidney disease due to polycystic kidney disease: Presented with acute renal failure and need for urgent hemodialysis. Admitted and received a temporary catheter and underwent hemodialysis on 05/08/2019.  Clinically improving.  Seen and followed by nephrology.  Patient will have subsequent dialysis. IR consulted for tunnel catheter, reportedly is planned for tomorrow.  Hypocalcemia: Management per nephrology.  Hypomagnesemia: Resolved.  Hypokalemia: Management per nephrology.  Hypophosphatemia: We will replace.  Anemia of chronic disease: Presented with hemoglobin of over 9.  Hemoglobin down to 7.0 today.  No symptoms.   Will recheck later today.  Transfuse if less than 7.  DVT prophylaxis: Heparin subcu Code Status: Full code Family Communication: None present Disposition Plan: Potential discharge in next 1 to 2 days when cleared by nephrology.  Consultants:   Nephrology  Procedures:   Hemodialysis  Antimicrobials:   None   Subjective: Patient seen and examined in dialysis unit.  He has no complaints.  Denies any pain.  Objective: Vitals:   05/13/19 1000 05/13/19 1030 05/13/19 1100 05/13/19 1109  BP: 93/72 106/72 99/77 (P) 111/70  Pulse: (!) 108 (!) 104 (!) 105 (!) 105  Resp: 20 19 (!) 25 16  Temp:    (P) 98.2 F (36.8 C)  TempSrc:    (P) Oral  SpO2:    (P) 98%  Weight:    (P) 94.3 kg  Height:        Intake/Output Summary (Last 24 hours) at 05/13/2019 1337 Last data filed at 05/13/2019 1109 Gross per 24 hour  Intake 1496.22 ml  Output 1356 ml  Net 140.22 ml   Filed Weights   05/11/19 1602 05/13/19 0701 05/13/19 1109  Weight: 91.9 kg 95.1 kg (P) 94.3 kg    Examination:  General exam: Appears calm and comfortable  Respiratory system: Clear to auscultation. Respiratory effort normal. Cardiovascular system: S1 & S2 heard, RRR. No JVD, murmurs, rubs, gallops or clicks. No pedal edema. Gastrointestinal system: Abdomen is nondistended, soft and nontender. No organomegaly or masses felt. Normal bowel sounds heard. Central nervous system: Alert and oriented. No focal neurological deficits. Extremities: Symmetric 5 x 5 power. Skin: No rashes, lesions or ulcers Psychiatry:  Judgement and insight appear poor. Mood & affect flat.  Data Reviewed: I have personally reviewed following labs and imaging studies  CBC: Recent Labs  Lab 05/08/19 1320 05/09/19 0400 05/10/19 0446 05/11/19 1334 05/13/19 0843  WBC 14.5* 13.6* 10.4 9.8 12.1*  NEUTROABS  --   --  7.7 6.9  --   HGB 9.5* 8.0* 7.4* 7.4* 7.0*  HCT 28.6* 23.2* 23.1* 23.2* 22.7*  MCV 91.4 88.2 92.8 95.9 99.1  PLT 280 217 198  191 [id number]   Basic Metabolic Panel: Recent Labs  Lab 05/09/19 0400 05/10/19 0446 05/11/19 0419 05/11/19 1730 05/12/19 0521 05/13/19 0523  NA 139 139 138  --  137 141  K 2.3* 2.8* 2.9*  --  3.4* 3.5  CL 100 101 103  --  102 109  CO2 16* 17* 21*  --  23 23  GLUCOSE 96 105* 105*  --  95 98  BUN 108* 111* 49*  --  23* 33*  CREATININE 15.06* 15.06* 8.00*  --  4.42* 5.69*  CALCIUM 5.2* 5.4* 6.1*  --  6.6* 6.4*  MG  --  1.5*  --  1.7  --   --   PHOS 4.6 5.2* 2.1*  --  1.3* 1.1*   GFR: Estimated Creatinine Clearance: 17.7 mL/min (A) (by C-G formula based on SCr of 5.69 mg/dL (H)). Liver Function Tests: Recent Labs  Lab 05/08/19 1320  05/09/19 0400 05/10/19 0446 05/11/19 0419 05/12/19 0521 05/13/19 0523  AST 32  --   --  32  --   --   --   ALT 43  --   --  33  --   --   --   ALKPHOS 56  --   --  41  --   --   --   BILITOT 1.0  --   --  0.7  --   --   --   PROT 8.7*  --   --  6.4*  --   --   --   ALBUMIN 3.4*   < > 2.9* 2.6* 2.4* 2.3* 2.3*   < > = values in this interval not displayed.   Recent Labs  Lab 05/08/19 1320  LIPASE 102*   No results for input(s): AMMONIA in the last 168 hours. Coagulation Profile: Recent Labs  Lab 05/08/19 2011  INR 1.4*   Cardiac Enzymes: No results for input(s): CKTOTAL, CKMB, CKMBINDEX, TROPONINI in the last 168 hours. BNP (last 3 results) No results for input(s): PROBNP in the last 8760 hours. HbA1C: No results for input(s): HGBA1C in the last 72 hours. CBG: Recent Labs  Lab 05/12/19 2156  GLUCAP 97   Lipid Profile: No results for input(s): CHOL, HDL, LDLCALC, TRIG, CHOLHDL, LDLDIRECT in the last 72 hours. Thyroid Function Tests: No results for input(s): TSH, T4TOTAL, FREET4, T3FREE, THYROIDAB in the last 72 hours. Anemia Panel: No results for input(s): VITAMINB12, FOLATE, FERRITIN, TIBC, IRON, RETICCTPCT in the last 72 hours. Sepsis Labs: No results for input(s): PROCALCITON, LATICACIDVEN in the last 168 hours.  Recent  Results (from the past 240 hour(s))  SARS CORONAVIRUS 2 Nasal Swab Aptima Multi Swab     Status: None   Collection Time: 05/08/19  2:36 PM   Specimen: Aptima Multi Swab; Nasal Swab  Result Value Ref Range Status   SARS Coronavirus 2 NEGATIVE NEGATIVE Final    Comment: (NOTE) SARS-CoV-2 target nucleic acids are NOT DETECTED. The SARS-CoV-2 RNA is generally detectable in upper and lower respiratory specimens  during the acute phase of infection. Negative results do not preclude SARS-CoV-2 infection, do not rule out co-infections with other pathogens, and should not be used as the sole basis for treatment or other patient management decisions. Negative results must be combined with clinical observations, patient history, and epidemiological information. The expected result is Negative. Fact Sheet for Patients: SugarRoll.be Fact Sheet for Healthcare Providers: https://www.woods-mathews.com/ This test is not yet approved or cleared by the [place] FDA and  has been authorized for detection and/or diagnosis of SARS-CoV-2 by FDA under an Emergency Use Authorization (EUA). This EUA will remain  in effect (meaning this test can be used) for the duration of the COVID-19 declaration under Section 56 4(b)(1) of the Act, 21 U.S.C. section 360bbb-3(b)(1), unless the authorization is terminated or revoked sooner. Performed at [facility], [place] [address], [place], [place] [address]   SARS Coronavirus 2 [facility] order, Performed in [facility] hospital lab) Nasopharyngeal Nasopharyngeal Swab     Status: None   Collection Time: 05/08/19  5:57 PM   Specimen: Nasopharyngeal Swab  Result Value Ref Range Status   SARS Coronavirus 2 NEGATIVE NEGATIVE Final    Comment: (NOTE) If result is NEGATIVE SARS-CoV-2 target nucleic acids are NOT DETECTED. The SARS-CoV-2 RNA is generally detectable in upper and lower  respiratory specimens during the acute  phase of infection. The lowest  concentration of SARS-CoV-2 viral copies this assay can detect is 250  copies / mL. A negative result does not preclude SARS-CoV-2 infection  and should not be used as the sole basis for treatment or other  patient management decisions.  A negative result may occur with  improper specimen collection / handling, submission of specimen other  than nasopharyngeal swab, presence of viral mutation(s) within the  areas targeted by this assay, and inadequate number of viral copies  (<250 copies / mL). A negative result must be combined with clinical  observations, patient history, and epidemiological information. If result is POSITIVE SARS-CoV-2 target nucleic acids are DETECTED. The SARS-CoV-2 RNA is generally detectable in upper and lower  respiratory specimens dur ing the acute phase of infection.  Positive  results are indicative of active infection with SARS-CoV-2.  Clinical  correlation with patient history and other diagnostic information is  necessary to determine patient infection status.  Positive results do  not rule out bacterial infection or co-infection with other viruses. If result is PRESUMPTIVE POSTIVE SARS-CoV-2 nucleic acids MAY BE PRESENT.   A presumptive positive result was obtained on the submitted specimen  and confirmed on repeat testing.  While 2019 novel coronavirus  (SARS-CoV-2) nucleic acids may be present in the submitted sample  additional confirmatory testing may be necessary for epidemiological  and / or clinical management purposes  to differentiate between  SARS-CoV-2 and other Sarbecovirus currently known to infect humans.  If clinically indicated additional testing with an alternate test  methodology [id number]) is advised. The SARS-CoV-2 RNA is generally  detectable in upper and lower respiratory sp ecimens during the acute  phase of infection. The expected result is Negative. Fact Sheet for Patients:   StrictlyIdeas.no Fact Sheet for Healthcare Providers: BankingDealers.co.za This test is not yet approved or cleared by the [place] FDA and has been authorized for detection and/or diagnosis of SARS-CoV-2 by FDA under an Emergency Use Authorization (EUA).  This EUA will remain in effect (meaning this test can be used) for the duration of the COVID-19 declaration under Section 564(b)(1) of the Act, 21  U.S.C. section 360bbb-3(b)(1), unless the authorization is terminated or revoked sooner. Performed at [facility], [place] [address] [place], [place] [address]   MRSA PCR Screening     Status: None   Collection Time: 05/08/19  9:03 PM   Specimen: Nasopharyngeal  Result Value Ref Range Status   MRSA by PCR NEGATIVE NEGATIVE Final    Comment:        The GeneXpert MRSA Assay (FDA approved for NASAL specimens only), is one component of a comprehensive MRSA colonization surveillance program. It is not intended to diagnose MRSA infection nor to guide or monitor treatment for MRSA infections. Performed at [facility], [place] [address], [place], [place] [address]      Radiology Studies: No results found.      Scheduled Meds:  calcitRIOL  1 mcg Oral BID   calcium carbonate  2 tablet Oral TID   Chlorhexidine Gluconate Cloth  6 each Topical Q0600   darbepoetin (ARANESP) injection - DIALYSIS  100 mcg Intravenous Q Sat-HD   docusate sodium  100 mg Oral BID   feeding supplement (NEPRO CARB STEADY)  237 mL Oral BID BM   multivitamin  1 tablet Oral QHS   Continuous Infusions:  sodium chloride 100 mL/hr at 05/13/19 0542   [START ON 05/14/2019] cefUROXime (ZINACEF)  IV     potassium PHOSPHATE IVPB (in mmol)       LOS: 5 days    Time spent: 25 minutes    Darliss Cheney, MD [facility] Pager [phone]  If 7PM-7AM, please contact night-coverage [email or web address] Password [facility] 05/13/2019, 1:37 PM

## 2019-05-13 NOTE — Progress Notes (Signed)
Patient out to dialysis. Alert and oriented, no acute distress noted.

## 2019-05-14 ENCOUNTER — Inpatient Hospital Stay (HOSPITAL_COMMUNITY): Payer: Medicaid Other

## 2019-05-14 ENCOUNTER — Inpatient Hospital Stay (HOSPITAL_COMMUNITY): Payer: Medicaid Other | Admitting: Certified Registered Nurse Anesthetist

## 2019-05-14 ENCOUNTER — Encounter (HOSPITAL_COMMUNITY)
Admission: EM | Disposition: A | Discharge status: Home / Self Care | Payer: Self-pay | Source: Home / Self Care | Attending: Family Medicine

## 2019-05-14 ENCOUNTER — Encounter (HOSPITAL_COMMUNITY): Payer: Self-pay | Admitting: Certified Registered Nurse Anesthetist

## 2019-05-14 DIAGNOSIS — N185 Chronic kidney disease, stage 5: Secondary | ICD-10-CM

## 2019-05-14 HISTORY — PX: INSERTION OF DIALYSIS CATHETER: SHX1324

## 2019-05-14 HISTORY — PX: BASCILIC VEIN TRANSPOSITION: SHX5742

## 2019-05-14 LAB — PROTIME-INR
INR: 1.2 (ref 0.8–1.2)
Prothrombin Time: 14.7 seconds (ref 11.4–15.2)

## 2019-05-14 LAB — CBC
HCT: 22.7 % — ABNORMAL LOW (ref 39.0–52.0)
Hemoglobin: 6.8 g/dL — CL (ref 13.0–17.0)
MCH: 30.5 pg (ref 26.0–34.0)
MCHC: 30 g/dL (ref 30.0–36.0)
MCV: 101.8 fL — ABNORMAL HIGH (ref 80.0–100.0)
Platelets: 165 10*3/uL (ref 150–400)
RBC: 2.23 MIL/uL — ABNORMAL LOW (ref 4.22–5.81)
RDW: 15.2 % (ref 11.5–15.5)
WBC: 11.5 10*3/uL — ABNORMAL HIGH (ref 4.0–10.5)
nRBC: 0.2 % (ref 0.0–0.2)

## 2019-05-14 LAB — PREPARE RBC (CROSSMATCH)

## 2019-05-14 LAB — BASIC METABOLIC PANEL
Anion gap: 10 (ref 5–15)
BUN: 14 mg/dL (ref 6–20)
CO2: 23 mmol/L (ref 22–32)
Calcium: 6.7 mg/dL — ABNORMAL LOW (ref 8.9–10.3)
Chloride: 104 mmol/L (ref 98–111)
Creatinine, Ser: 3.47 mg/dL — ABNORMAL HIGH (ref 0.61–1.24)
GFR calc Af Amer: 22 mL/min — ABNORMAL LOW (ref >60)
GFR calc non Af Amer: 19 mL/min — ABNORMAL LOW (ref >60)
Glucose, Bld: 89 mg/dL (ref 70–99)
Potassium: 3.7 mmol/L (ref 3.5–5.1)
Sodium: 137 mmol/L (ref 135–145)

## 2019-05-14 LAB — PHOSPHORUS: Phosphorus: 1.6 mg/dL — ABNORMAL LOW (ref 2.5–4.6)

## 2019-05-14 LAB — ABO/RH: ABO/RH(D): A POS

## 2019-05-14 SURGERY — TRANSPOSITION, VEIN, BASILIC
Anesthesia: General | Site: Neck | Laterality: Right

## 2019-05-14 MED ORDER — PHENYLEPHRINE 40 MCG/ML (10ML) SYRINGE FOR IV PUSH (FOR BLOOD PRESSURE SUPPORT)
PREFILLED_SYRINGE | INTRAVENOUS | Status: AC
  Filled 2019-05-14: qty 10

## 2019-05-14 MED ORDER — LIDOCAINE 2% (20 MG/ML) 5 ML SYRINGE
INTRAMUSCULAR | Status: DC | PRN
  Administered 2019-05-14: 60 mg via INTRAVENOUS

## 2019-05-14 MED ORDER — LIDOCAINE 2% (20 MG/ML) 5 ML SYRINGE
INTRAMUSCULAR | Status: AC
  Filled 2019-05-14: qty 5

## 2019-05-14 MED ORDER — FENTANYL CITRATE (PF) 250 MCG/5ML IJ SOLN
INTRAMUSCULAR | Status: DC | PRN
  Administered 2019-05-14 (×4): 25 ug via INTRAVENOUS

## 2019-05-14 MED ORDER — CALCIUM CHLORIDE 10 % IV SOLN
INTRAVENOUS | Status: DC | PRN
  Administered 2019-05-14: 200 mg via INTRAVENOUS
  Administered 2019-05-14: 100 mg via INTRAVENOUS
  Administered 2019-05-14: 200 mg via INTRAVENOUS
  Administered 2019-05-14: 100 mg via INTRAVENOUS
  Administered 2019-05-14: 200 mg via INTRAVENOUS

## 2019-05-14 MED ORDER — PHENYLEPHRINE 40 MCG/ML (10ML) SYRINGE FOR IV PUSH (FOR BLOOD PRESSURE SUPPORT)
PREFILLED_SYRINGE | INTRAVENOUS | Status: DC | PRN
  Administered 2019-05-14: 120 ug via INTRAVENOUS
  Administered 2019-05-14: 80 ug via INTRAVENOUS
  Administered 2019-05-14: 120 ug via INTRAVENOUS
  Administered 2019-05-14: 80 ug via INTRAVENOUS

## 2019-05-14 MED ORDER — LIDOCAINE HCL (PF) 1 % IJ SOLN
INTRAMUSCULAR | Status: AC
  Filled 2019-05-14: qty 30

## 2019-05-14 MED ORDER — SODIUM CHLORIDE 0.9 % IV SOLN
INTRAVENOUS | Status: DC | PRN
  Administered 2019-05-14: 10:00:00 20 ug/min via INTRAVENOUS

## 2019-05-14 MED ORDER — ONDANSETRON HCL 4 MG/2ML IJ SOLN: 4.0000 mg | Freq: Once | INTRAMUSCULAR | Status: DC | PRN

## 2019-05-14 MED ORDER — SODIUM CHLORIDE 0.9% IV SOLUTION: Freq: Once | INTRAVENOUS | Status: DC

## 2019-05-14 MED ORDER — CALCIUM CHLORIDE 10 % IV SOLN
INTRAVENOUS | Status: AC
  Filled 2019-05-14: qty 10

## 2019-05-14 MED ORDER — FENTANYL CITRATE (PF) 100 MCG/2ML IJ SOLN: 25.0000 ug | INTRAMUSCULAR | Status: DC | PRN

## 2019-05-14 MED ORDER — FENTANYL CITRATE (PF) 250 MCG/5ML IJ SOLN
INTRAMUSCULAR | Status: AC
  Filled 2019-05-14: qty 5

## 2019-05-14 MED ORDER — 0.9 % SODIUM CHLORIDE (POUR BTL) OPTIME
TOPICAL | Status: DC | PRN
  Administered 2019-05-14: 1000 mL

## 2019-05-14 MED ORDER — SODIUM CHLORIDE 0.9 % IV SOLN
INTRAVENOUS | Status: DC
  Administered 2019-05-14 (×2): via INTRAVENOUS

## 2019-05-14 MED ORDER — OXYCODONE HCL 5 MG/5ML PO SOLN: 5.0000 mg | Freq: Once | ORAL | Status: DC | PRN

## 2019-05-14 MED ORDER — ONDANSETRON HCL 4 MG/2ML IJ SOLN
INTRAMUSCULAR | Status: DC | PRN
  Administered 2019-05-14: 4 mg via INTRAVENOUS

## 2019-05-14 MED ORDER — MIDAZOLAM HCL 2 MG/2ML IJ SOLN
INTRAMUSCULAR | Status: AC
  Filled 2019-05-14: qty 2

## 2019-05-14 MED ORDER — SODIUM CHLORIDE 0.9 % IV SOLN
INTRAVENOUS | Status: DC | PRN
  Administered 2019-05-14: 500 mL

## 2019-05-14 MED ORDER — HEPARIN SODIUM (PORCINE) 1000 UNIT/ML IJ SOLN
INTRAMUSCULAR | Status: DC | PRN
  Administered 2019-05-14: 3400 [IU] via INTRAVENOUS

## 2019-05-14 MED ORDER — POTASSIUM PHOSPHATES 15 MMOLE/5ML IV SOLN
10.0000 mmol | INTRAVENOUS | Status: AC
  Administered 2019-05-14: 17:00:00 10 mmol via INTRAVENOUS
  Filled 2019-05-14: qty 3.33

## 2019-05-14 MED ORDER — LIDOCAINE-EPINEPHRINE 0.5 %-1:200000 IJ SOLN
INTRAMUSCULAR | Status: AC
  Filled 2019-05-14: qty 1

## 2019-05-14 MED ORDER — HEPARIN SODIUM (PORCINE) 1000 UNIT/ML IJ SOLN
INTRAMUSCULAR | Status: AC
  Filled 2019-05-14: qty 1

## 2019-05-14 MED ORDER — PHOSPHA 250 NEUTRAL 155-852-130 MG PO TABS: 1.0000 | ORAL_TABLET | Freq: Two times a day (BID) | ORAL | 0 refills | Status: AC

## 2019-05-14 MED ORDER — SODIUM CHLORIDE 0.9 % IV SOLN
INTRAVENOUS | Status: AC
  Filled 2019-05-14: qty 1.2

## 2019-05-14 MED ORDER — ONDANSETRON HCL 4 MG/2ML IJ SOLN
INTRAMUSCULAR | Status: AC
  Filled 2019-05-14: qty 2

## 2019-05-14 MED ORDER — PROPOFOL 10 MG/ML IV BOLUS
INTRAVENOUS | Status: DC | PRN
  Administered 2019-05-14: 160 mg via INTRAVENOUS

## 2019-05-14 MED ORDER — OXYCODONE-ACETAMINOPHEN 5-325 MG PO TABS: 1.0000 | ORAL_TABLET | Freq: Four times a day (QID) | ORAL | Status: DC | PRN

## 2019-05-14 MED ORDER — PROPOFOL 10 MG/ML IV BOLUS
INTRAVENOUS | Status: AC
  Filled 2019-05-14: qty 20

## 2019-05-14 MED ORDER — OXYCODONE HCL 5 MG PO TABS: 5.0000 mg | ORAL_TABLET | Freq: Once | ORAL | Status: DC | PRN

## 2019-05-14 MED ORDER — CALCITRIOL 0.5 MCG PO CAPS: 1.0000 ug | ORAL_CAPSULE | Freq: Two times a day (BID) | ORAL | 0 refills | Status: AC

## 2019-05-14 MED ORDER — DEXAMETHASONE SODIUM PHOSPHATE 10 MG/ML IJ SOLN
INTRAMUSCULAR | Status: AC
  Filled 2019-05-14: qty 1

## 2019-05-14 MED ORDER — CALCIUM GLUCONATE-NACL 1-0.675 GM/50ML-% IV SOLN
1.0000 g | INTRAVENOUS | Status: AC
  Administered 2019-05-14: 1000 mg via INTRAVENOUS
  Filled 2019-05-14: qty 50

## 2019-05-14 SURGICAL SUPPLY — 57 items
ARMBAND PINK RESTRICT EXTREMIT (MISCELLANEOUS) ×4 IMPLANT
BAG DECANTER FOR FLEXI CONT (MISCELLANEOUS) ×4 IMPLANT
BIOPATCH RED 1 DISK 7.0 (GAUZE/BANDAGES/DRESSINGS) ×3 IMPLANT
BIOPATCH RED 1IN DISK 7.0MM (GAUZE/BANDAGES/DRESSINGS) ×1
CANISTER SUCT 3000ML PPV (MISCELLANEOUS) ×4 IMPLANT
CANNULA VESSEL 3MM 2 BLNT TIP (CANNULA) ×4 IMPLANT
CATH PALINDROME RT-P 15FX19CM (CATHETERS) IMPLANT
CATH PALINDROME RT-P 15FX23CM (CATHETERS) ×2 IMPLANT
CATH PALINDROME RT-P 15FX28CM (CATHETERS) IMPLANT
CATH PALINDROME RT-P 15FX55CM (CATHETERS) IMPLANT
CLIP LIGATING EXTRA MED SLVR (CLIP) ×4 IMPLANT
CLIP LIGATING EXTRA SM BLUE (MISCELLANEOUS) ×4 IMPLANT
COVER PROBE W GEL 5X96 (DRAPES) ×4 IMPLANT
COVER SURGICAL LIGHT HANDLE (MISCELLANEOUS) ×4 IMPLANT
DECANTER SPIKE VIAL GLASS SM (MISCELLANEOUS) ×4 IMPLANT
DERMABOND ADVANCED (GAUZE/BANDAGES/DRESSINGS) ×2
DERMABOND ADVANCED .7 DNX12 (GAUZE/BANDAGES/DRESSINGS) ×2 IMPLANT
DRAPE C-ARM 42X72 X-RAY (DRAPES) ×4 IMPLANT
DRAPE CHEST BREAST 15X10 FENES (DRAPES) ×4 IMPLANT
ELECT REM PT RETURN 9FT ADLT (ELECTROSURGICAL) ×4
ELECTRODE REM PT RTRN 9FT ADLT (ELECTROSURGICAL) ×2 IMPLANT
GAUZE 4X4 16PLY RFD (DISPOSABLE) ×4 IMPLANT
GLOVE BIO SURGEON STRL SZ 6.5 (GLOVE) ×2 IMPLANT
GLOVE BIO SURGEONS STRL SZ 6.5 (GLOVE) ×2
GLOVE BIOGEL PI IND STRL 6.5 (GLOVE) IMPLANT
GLOVE BIOGEL PI INDICATOR 6.5 (GLOVE) ×8
GLOVE ECLIPSE 6.5 STRL STRAW (GLOVE) ×2 IMPLANT
GLOVE SS BIOGEL STRL SZ 7.5 (GLOVE) ×2 IMPLANT
GLOVE SUPERSENSE BIOGEL SZ 7.5 (GLOVE) ×2
GOWN STRL REUS W/ TWL LRG LVL3 (GOWN DISPOSABLE) ×6 IMPLANT
GOWN STRL REUS W/TWL LRG LVL3 (GOWN DISPOSABLE) ×10
KIT BASIN OR (CUSTOM PROCEDURE TRAY) ×4 IMPLANT
KIT TURNOVER KIT B (KITS) ×4 IMPLANT
NDL 18GX1X1/2 (RX/OR ONLY) (NEEDLE) ×2 IMPLANT
NDL HYPO 25GX1X1/2 BEV (NEEDLE) ×2 IMPLANT
NEEDLE 18GX1X1/2 (RX/OR ONLY) (NEEDLE) ×4 IMPLANT
NEEDLE 22X1 1/2 (OR ONLY) (NEEDLE) IMPLANT
NEEDLE HYPO 25GX1X1/2 BEV (NEEDLE) IMPLANT
NS IRRIG 1000ML POUR BTL (IV SOLUTION) ×4 IMPLANT
PACK CV ACCESS (CUSTOM PROCEDURE TRAY) ×4 IMPLANT
PACK SURGICAL SETUP 50X90 (CUSTOM PROCEDURE TRAY) ×4 IMPLANT
PAD ARMBOARD 7.5X6 YLW CONV (MISCELLANEOUS) ×8 IMPLANT
SOAP 2 % CHG 4 OZ (WOUND CARE) ×4 IMPLANT
SUT ETHILON 3 0 PS 1 (SUTURE) ×4 IMPLANT
SUT PROLENE 6 0 CC (SUTURE) ×4 IMPLANT
SUT SILK 2 0 SH (SUTURE) IMPLANT
SUT VIC AB 3-0 SH 27 (SUTURE) ×2
SUT VIC AB 3-0 SH 27X BRD (SUTURE) ×2 IMPLANT
SUT VICRYL 4-0 PS2 18IN ABS (SUTURE) ×6 IMPLANT
SYR 10ML LL (SYRINGE) ×4 IMPLANT
SYR 20ML LL LF (SYRINGE) ×4 IMPLANT
SYR 5ML LL (SYRINGE) ×8 IMPLANT
SYR CONTROL 10ML LL (SYRINGE) ×4 IMPLANT
TOWEL GREEN STERILE (TOWEL DISPOSABLE) ×8 IMPLANT
TOWEL GREEN STERILE FF (TOWEL DISPOSABLE) ×4 IMPLANT
UNDERPAD 30X30 (UNDERPADS AND DIAPERS) ×4 IMPLANT
WATER STERILE IRR 1000ML POUR (IV SOLUTION) ×4 IMPLANT

## 2019-05-14 NOTE — Anesthesia Procedure Notes (Signed)
Procedure Name: LMA Insertion Date/Time: 05/14/2019 9:29 AM Performed by: Harden Mo, CRNA Pre-anesthesia Checklist: Patient identified, Emergency Drugs available, Suction available and Patient being monitored Patient Re-evaluated:Patient Re-evaluated prior to induction Oxygen Delivery Method: Circle System Utilized Preoxygenation: Pre-oxygenation with 100% oxygen Induction Type: IV induction LMA: LMA inserted LMA Size: 5.0 Number of attempts: 1 Airway Equipment and Method: Bite block Placement Confirmation: positive ETCO2 Tube secured with: Tape Dental Injury: Teeth and Oropharynx as per pre-operative assessment

## 2019-05-14 NOTE — Progress Notes (Signed)
Patient has been accepted at [facility] for OP HD treatment on a TTS schedule with a seat time of 12:30pm. He should arrive 20 minutes early for his appointments. Patient is having access placed today. Renal Navigator spoke with Nephrologist/Dr. Marval Regal regarding schedule and plan for discharge/clinic start date. If patient is discharged today following access, he should report to the clinic tomorrow, Friday, 8/14 to sign intake paperwork in order to start in the clinic for his first OP HD treatment on 05/16/19. Renal Navigator will meet with patient to discuss this following surgery today. Renal Navigator contacted patient's RN/Sarah to request a call when patient is back to his room and awake from surgery. RN agrees.  Bernard Jordan, [place] Renal Navigator [phone]

## 2019-05-14 NOTE — Anesthesia Postprocedure Evaluation (Signed)
Anesthesia Post Note  Patient: Bernard Jordan  Procedure(s) Performed: BASILIC VEIN TRANSPOSITION (Left Arm Upper) INSERTION OF TUNNELED DIALYSIS CATHETER (Right Neck)     Patient location during evaluation: PACU Anesthesia Type: General Level of consciousness: awake and alert Pain management: pain level controlled Vital Signs Assessment: post-procedure vital signs reviewed and stable Respiratory status: spontaneous breathing, nonlabored ventilation, respiratory function stable and patient connected to nasal cannula oxygen Cardiovascular status: blood pressure returned to baseline and stable Postop Assessment: no apparent nausea or vomiting Anesthetic complications: no    Last Vitals:  Vitals:   05/14/19 1255 05/14/19 1321  BP: 93/75 95/75  Pulse: (!) 108 (!) 105  Resp:  16  Temp: 36.6 C 36.6 C  SpO2: 100% 100%    Last Pain:  Vitals:   05/14/19 1321  TempSrc: Oral  PainSc:                  Bernard Jordan

## 2019-05-14 NOTE — Transfer of Care (Signed)
Immediate Anesthesia Transfer of Care Note  Patient: Bernard Jordan  Procedure(s) Performed: BASILIC VEIN TRANSPOSITION (Left Arm Upper) INSERTION OF TUNNELED DIALYSIS CATHETER (Right Neck)  Patient Location: PACU  Anesthesia Type:General  Level of Consciousness: awake, alert  and oriented  Airway & Oxygen Therapy: Patient Spontanous Breathing  Post-op Assessment: Report given to RN, Post -op Vital signs reviewed and stable and Patient moving all extremities X 4  Post vital signs: Reviewed and stable  Last Vitals:  Vitals Value Taken Time  BP 121/73 05/14/19 1115  Temp    Pulse 108 05/14/19 1119  Resp 30 05/14/19 1119  SpO2 100 % 05/14/19 1119  Vitals shown include unvalidated device data.  Last Pain:  Vitals:   05/13/19 2000  TempSrc:   PainSc: 0-No pain      Patients Stated Pain Goal: 0 ([id number])  Complications: No apparent anesthesia complications

## 2019-05-14 NOTE — Progress Notes (Signed)
Nutrition Follow-up  DOCUMENTATION CODES:   Obesity unspecified  INTERVENTION:   -Continue Nepro Shake po BID, each supplement provides 425 kcal and 19 grams protein -Continue renal MVI daily -Provided "Food Pyramid for Healthy Eating with Kidney Disease" handout in AVS/ discharge summary; pt will also have access to RD at outpatient HD center for further reinforcement  NUTRITION DIAGNOSIS:   Increased nutrient needs related to chronic illness(ESRD on HD) as evidenced by estimated needs.  Ongoing  GOAL:   Patient will meet greater than or equal to 90% of their needs  Progressing   MONITOR:   PO intake, Labs, Supplement acceptance, Weight trends, I & O's  REASON FOR ASSESSMENT:   Malnutrition Screening Tool    ASSESSMENT:   53 year old male with past medical history of polycystic kidney disease, CKD; pt does not follow with any physicians, who presented to ED with complaints of n/v after eating seafood and steak about a week ago. Patient has been feeling nauseated since, with poor po; unable to drink or eat, reports itching, lethargy, and generally not  feeling well. In ED pt noted to have creatinine of 25, BUN of 78, Ca 4.9; pt admitted and started on HD with right IJ catheter.  8/10- vein mapping completed 8/13- s/p Procedure(s): BASILIC VEIN TRANSPOSITION (Left) INSERTION OF DIALYSIS CATHETER (N/A)  Reviewed I/O's: +656 ml x 24 hours and +4.6 L x 24 hours  UOP: 400 ml x 24 hours  Pt down in OR for HD cath placement. Unable to speak with pt at this time.   Pt with good appetite; noted meal completion 50-100%. Pt is also accepting Nepro supplement and renal MVI.  Per renal CSW notes, pt with outpatient HD assignment and may discharge today following nephrology assessment.   Medications reviewed and include calcium carbonate, colace, and 0.9% sodium chloride @ 100 ml/hr.   Labs reviewed: Phos: 1.6.   Diet Order:   Diet Order            Diet NPO time specified  Except for: Sips with Meds  Diet effective midnight              EDUCATION NEEDS:   Not appropriate for education at this time  Skin:  Skin Assessment: Reviewed RN Assessment  Last BM:  05/12/19  Height:   Ht Readings from Last 1 Encounters:  05/08/19 5\' 11"  (1.803 m)    Weight:   Wt Readings from Last 1 Encounters:  05/13/19 94.3 kg    Ideal Body Weight:  70.5 kg  BMI:  Body mass index is 29 kg/m.  Estimated Nutritional Needs:   Kcal:  2100-2300  Protein:  105-120 grams  Fluid:  1000 ml + UOP    Jaydee Conran A. Jimmye Norman, RD, LDN, [place] Registered Dietitian II Certified Diabetes Care and Education Specialist Pager: [phone] After hours Pager: [phone]

## 2019-05-14 NOTE — Progress Notes (Signed)
VAST consulted to draw T&S prior to surgery. Upon arrival at bedside in SS, unit RN verified pt's labs had just been collected by staff.

## 2019-05-14 NOTE — Interval H&P Note (Signed)
History and Physical Interval Note:  05/14/2019 8:23 AM  Bernard Jordan  has presented today for surgery, with the diagnosis of END STAGE RENAL DISEASE.  The various methods of treatment have been discussed with the patient and family. After consideration of risks, benefits and other options for treatment, the patient has consented to  Procedure(s): BASILIC VEIN TRANSPOSITION (Left) INSERTION OF DIALYSIS CATHETER (N/A) as a surgical intervention.  The patient's history has been reviewed, patient examined, no change in status, stable for surgery.  I have reviewed the patient's chart and labs.  Questions were answered to the patient's satisfaction.     Curt Jews

## 2019-05-14 NOTE — Progress Notes (Signed)
Patient ID: Bernard Jordan, male   DOB: 06-11-1966, 53 y.o.   MRN: [id number] S: s/p creation of left BVT (first stage) today with drop in Hgb to 6.8 and getting blood. O:BP 95/75   Pulse (!) 105   Temp 97.8 F (36.6 C) (Oral)   Resp 16   Ht 5\' 11"  (1.803 m)   Wt 94.3 kg   SpO2 100%   BMI 29.00 kg/m   Intake/Output Summary (Last 24 hours) at 05/14/2019 1349 Last data filed at 05/14/2019 1255 Gross per 24 hour  Intake 2422 ml  Output 420 ml  Net 2002 ml   Intake/Output: I/O last 3 completed shifts: In: 3408.2 [P.O.:480; I.V.:2928.2] Out: 1756 [Urine:900; Other:856]  Intake/Output this shift:  Total I/O In: 510 [I.V.:510] Out: 20 [Blood:20] Weight change:  Gen: NAD CVS: tachy, no rub Resp: cta Abd: benign Ext: no edema, LUE AVF +T/B  Recent Labs  Lab 05/08/19 1320 05/08/19 2011 05/09/19 0400 05/10/19 0446 05/11/19 0419 05/12/19 0521 05/13/19 0523 05/14/19 0430  NA 139 138 139 139 138 137 141 137  K 3.2* 2.9* 2.3* 2.8* 2.9* 3.4* 3.5 3.7  CL 98 101 100 101 103 102 109 104  CO2 9* 9* 16* 17* 21* 23 23 23   GLUCOSE 138* 195* 96 105* 105* 95 98 89  BUN 178* 168* 108* 111* 49* 23* 33* 14  CREATININE 25.65* 22.63* 15.06* 15.06* 8.00* 4.42* 5.69* 3.47*  ALBUMIN 3.4* 3.1* 2.9* 2.6* 2.4* 2.3* 2.3*  --   CALCIUM 4.1* 4.1* 5.2* 5.4* 6.1* 6.6* 6.4* 6.7*  PHOS  --  >30.0* 4.6 5.2* 2.1* 1.3* 1.1* 1.6*  AST 32  --   --  32  --   --   --   --   ALT 43  --   --  33  --   --   --   --    Liver Function Tests: Recent Labs  Lab 05/08/19 1320  05/10/19 0446 05/11/19 0419 05/12/19 0521 05/13/19 0523  AST 32  --  32  --   --   --   ALT 43  --  33  --   --   --   ALKPHOS 56  --  41  --   --   --   BILITOT 1.0  --  0.7  --   --   --   PROT 8.7*  --  6.4*  --   --   --   ALBUMIN 3.4*   < > 2.6* 2.4* 2.3* 2.3*   < > = values in this interval not displayed.   Recent Labs  Lab 05/08/19 1320  LIPASE 102*   No results for input(s): AMMONIA in the last 168 hours. CBC: Recent  Labs  Lab 05/09/19 0400 05/10/19 0446 05/11/19 1334 05/13/19 0843 05/14/19 0430  WBC 13.6* 10.4 9.8 12.1* 11.5*  NEUTROABS  --  7.7 6.9  --   --   HGB 8.0* 7.4* 7.4* 7.0* 6.8*  HCT 23.2* 23.1* 23.2* 22.7* 22.7*  MCV 88.2 92.8 95.9 99.1 101.8*  PLT 217 198 191 207 165   Cardiac Enzymes: No results for input(s): CKTOTAL, CKMB, CKMBINDEX, TROPONINI in the last 168 hours. CBG: Recent Labs  Lab 05/12/19 2156  GLUCAP 97    Iron Studies: No results for input(s): IRON, TIBC, TRANSFERRIN, FERRITIN in the last 72 hours. Studies/Results: Dg Chest Port 1 View  Result Date: 05/14/2019 CLINICAL DATA:  Dialysis catheter insertion. EXAM: PORTABLE CHEST 1 VIEW COMPARISON:  05/08/2019. FINDINGS: Dialysis catheter noted with tip at cavoatrial junction. Heart size normal. Low lung volumes with mild bibasilar subsegmental atelectasis. No pleural effusion or pneumothorax. No acute bony abnormality. IMPRESSION: 1.  Dialysis catheter with tip at cavoatrial junction. 2.  Low lung volumes with mild bibasilar atelectasis. Electronically Signed   By: Marcello Moores  Register   On: 05/14/2019 13:29   Dg Cyndy Freeze Guide Cv Line-no Report  Result Date: 05/14/2019 Fluoroscopy was utilized by the requesting physician.  No radiographic interpretation.   . sodium chloride   Intravenous Once  . calcitRIOL  1 mcg Oral BID  . calcium carbonate  2 tablet Oral TID  . Chlorhexidine Gluconate Cloth  6 each Topical Q0600  . darbepoetin (ARANESP) injection - DIALYSIS  100 mcg Intravenous Q Sat-HD  . docusate sodium  100 mg Oral BID  . feeding supplement (NEPRO CARB STEADY)  237 mL Oral BID BM  . multivitamin  1 tablet Oral QHS    BMET    Component Value Date/Time   NA 137 05/14/2019 0430   K 3.7 05/14/2019 0430   CL 104 05/14/2019 0430   CO2 23 05/14/2019 0430   GLUCOSE 89 05/14/2019 0430   BUN 14 05/14/2019 0430   CREATININE 3.47 (H) 05/14/2019 0430   CALCIUM 6.7 (L) 05/14/2019 0430   GFRNONAA 19 (L) 05/14/2019 0430    GFRAA 22 (L) 05/14/2019 0430   CBC    Component Value Date/Time   WBC 11.5 (H) 05/14/2019 0430   RBC 2.23 (L) 05/14/2019 0430   HGB 6.8 (LL) 05/14/2019 0430   HCT 22.7 (L) 05/14/2019 0430   PLT 165 05/14/2019 0430   MCV 101.8 (H) 05/14/2019 0430   MCH 30.5 05/14/2019 0430   MCHC 30.0 05/14/2019 0430   RDW 15.2 05/14/2019 0430   LYMPHSABS 1.0 05/11/2019 1334   MONOABS 1.0 05/11/2019 1334   EOSABS 0.7 (H) 05/11/2019 1334   BASOSABS 0.0 05/11/2019 1334    Assessment/Plan:  1. New ESRD- due to PCKD- s/p first HD on 05/08/19 and3rd on 05/11/19. He is tolerating it welland plan for HD tomorrow. 1. He is set up for HD TTS at [facility] and will sign paperwork tomorrow and start HD at 12 Saturday.  2. Awaiting permanent access placement. 2. HTN/Volume- stable 3. Anemia- stable cont with ESA, s/p blood transfusion post op. 4. SHPTH- low phos. No binders. Cont with calcitriol 5. Hypokalemia- replete and use added K bath 6. Vascular access- have consulted [facility] for AVF/AVG and tunneled HD catheter placementfor 05/14/19 7. Disposition- stable for discharge and f/u with outpatient HD on Saturday.  Donetta Potts, MD [employer] [phone]

## 2019-05-14 NOTE — Discharge Instructions (Addendum)
° ° ° ° °  [facility]  Discharge Instructions  AV Fistula or Graft Surgery for Dialysis Access  Please refer to the following instructions for your post-procedure care. Your surgeon or physician assistant will discuss any changes with you.  Activity  You may drive the day following your surgery, if you are comfortable and no longer taking prescription pain medication. Resume full activity as the soreness in your incision resolves.  Bathing/Showering  You may shower after you go home. Keep your incision dry for 48 hours. Do not soak in a bathtub, hot tub, or swim until the incision heals completely. You may not shower if you have a hemodialysis catheter.  Incision Care  Clean your incision with mild soap and water after 48 hours. Pat the area dry with a clean towel. You do not need a bandage unless otherwise instructed. Do not apply any ointments or creams to your incision. You may have skin glue on your incision. Do not peel it off. It will come off on its own in about one week. Your arm may swell a bit after surgery. To reduce swelling use pillows to elevate your arm so it is above your heart. Your doctor will tell you if you need to lightly wrap your arm with an ACE bandage.  Diet  Resume your normal diet. There are not special food restrictions following this procedure. In order to heal from your surgery, it is CRITICAL to get adequate nutrition. Your body requires vitamins, minerals, and protein. Vegetables are the best source of vitamins and minerals. Vegetables also provide the perfect balance of protein. Processed food has little nutritional value, so try to avoid this.  Medications  Resume taking all of your medications. If your incision is causing pain, you may take over-the counter pain relievers such as acetaminophen (Tylenol). If you were prescribed a stronger pain medication, please be aware these medications can cause nausea and constipation.  Prevent nausea by taking the medication with a snack or meal. Avoid constipation by drinking plenty of fluids and eating foods with high amount of fiber, such as fruits, vegetables, and grains.  Do not take Tylenol if you are taking prescription pain medications.  Follow up Your surgeon may want to see you in the office following your access surgery. If so, this will be arranged at the time of your surgery.  Please call us immediately for any of the following conditions:  Increased pain, redness, drainage (pus) from your incision site Fever of 101 degrees or higher Severe or worsening pain at your incision site Hand pain or numbness.  Reduce your risk of vascular disease:  Stop smoking. If you would like help, call [facility] at 1-800-QUIT-NOW (727) 725-8705) or [facility] at [place] your cholesterol Maintain a desired weight Control your diabetes Keep your blood pressure down  Dialysis  It will take several weeks to several months for your new dialysis access to be ready for use. Your surgeon will determine when it is okay to use it. Your nephrologist will continue to direct your dialysis. You can continue to use your Permcath until your new access is ready for use.   05/14/2019 [facility] [id number] Mar 11, 1966  Surgeon(s): Early, Arvilla Meres, MD  Procedure(s): Creation left 1st stage basilic vein transposition INSERTION OF TUNNELED DIALYSIS CATHETER  x Do not stick fistula for 12 weeks    If you have any questions, please call the office at [phone].

## 2019-05-14 NOTE — Anesthesia Preprocedure Evaluation (Signed)
Anesthesia Evaluation  Patient identified by MRN, date of birth, ID band Patient awake    Reviewed: Allergy & Precautions, NPO status , Patient's Chart, lab work & pertinent test results  Airway Mallampati: II  TM Distance: >3 FB Neck ROM: Full    Dental  (+) Teeth Intact, Dental Advisory Given   Pulmonary     + decreased breath sounds      Cardiovascular  Rhythm:Regular Rate:Normal     Neuro/Psych    GI/Hepatic   Endo/Other    Renal/GU      Musculoskeletal   Abdominal   Peds  Hematology   Anesthesia Other Findings   Reproductive/Obstetrics                             Anesthesia Physical Anesthesia Plan  ASA: III  Anesthesia Plan: General   Post-op Pain Management:    Induction: Intravenous  PONV Risk Score and Plan:   Airway Management Planned: LMA  Additional Equipment:   Intra-op Plan:   Post-operative Plan:   Informed Consent: I have reviewed the patients History and Physical, chart, labs and discussed the procedure including the risks, benefits and alternatives for the proposed anesthesia with the patient or authorized representative who has indicated his/her understanding and acceptance.     Dental advisory given  Plan Discussed with: CRNA and Anesthesiologist  Anesthesia Plan Comments:         Anesthesia Quick Evaluation

## 2019-05-14 NOTE — Op Note (Signed)
    OPERATIVE REPORT  DATE OF SURGERY: 05/14/2019  PATIENT: Bernard Jordan, 53 y.o. male MRN: [id number]  DOB: [birth year]  PRE-OPERATIVE DIAGNOSIS: End-stage renal disease  POST-OPERATIVE DIAGNOSIS:  Same  PROCEDURE: #1 right IJ tunneled hemodialysis catheter, #2 left for stage basilic vein transposition fistula  SURGEON:  Curt Jews, M.D.  PHYSICIAN ASSISTANT: Liana Crocker, PA-C  ANESTHESIA: LMA  EBL: per anesthesia record  Total I/O In: 500 [I.V.:500] Out: 20 [Blood:20]  BLOOD ADMINISTERED: none  DRAINS: none  SPECIMEN: none  COUNTS CORRECT:  YES  PATIENT DISPOSITION:  PACU - hemodynamically stable  PROCEDURE DETAILS: Patient was taken the operating place evaluation where the area of the left and right neck and chest prepped draped in sterile fashion.  The patient had an indwelling temporary catheter in the right IJ.  Guidewire was passed through this and the catheter was removed.  The guidewire was in the level of the distal right atrium this was confirmed on fluoroscopy.  Dilator and peel-away sheath was passed over the guidewire and the dilator and guidewire were removed.  A 23 cm catheter was positioned to level the distal right atrium.  The peel-away sheath was removed.  The catheter was brought through a separate stab incision through a separate subcutaneous tunnel.  The 2 lm ports were attached and both lumens flushed and aspirated easily and were locked with 1000/cc heparin.  The catheter was secured to the skin with a 309 nylon stitch and the entry site was closed with a 4 subcuticular Vicryl stitch.  Attention was then turned to the left arm.  SonoSite ultrasound was used to visualize the veins.  The patient had clot in his cephalic vein.  The basilic vein was of good size and was patent.  Incision was made over the basilic vein on the medial aspect of the elbow and tributary branches were ligated with 3-0 and 4-0 silk ties and divided.  The vein was ligated distally  and divided and was gently dilated with heparinized saline was of good caliber.  The vein was mobilized to the level of the brachial artery.  A separate incision was made over the brachial artery.  The artery was occluded proximally distally and was opened 11 blade simultaneous Potts scissors.  The graft cut the appropriate length and was sewn end-to-side to the artery with a running 6-0 Prolene suture.  Clamps removed and excellent thrill was noted in the fistula.  Wounds irrigated with saline.  Hemostasis to electrocautery.  Wounds were closed with 3-0 Vicryl in the subcutaneous septic tissue.  Sterile dressing was applied the patient was transferred to the recovery room where chest x-rays pending   Rosetta Posner, M.D., [facility] 05/14/2019 11:12 AM

## 2019-05-14 NOTE — Progress Notes (Signed)
Renal Navigator met with patient at bedside to notify of OP HD clinic acceptance and schedule at [facility]. Patient stated understanding and no questions. He states he can sign intake paperwork tomorrow and understands his first treatment will be on Saturday, 05/16/19 at 12:30pm, with an arrival time of 12:10pm. He has all information in writing from Renal Navigator. Renal Navigator follow up with RN/Sarah after meeting with patient. Patient is cleared for discharge from an OP HD standpoint.  Alphonzo Cruise, Cataract Renal Navigator [phone]

## 2019-05-14 NOTE — Progress Notes (Signed)
CRITICAL VALUE ALERT  Critical Value:  Hgb 6.8  Date & Time Notied:  05/14/2019 0534  Provider Notified: Schorr  Orders Received/Actions taken: 1 unit of blood ordered

## 2019-05-14 NOTE — Progress Notes (Signed)
MD placed DC order. Pt was receiving blood transfusion and orders for Potassium phosphate & calcium gluconate placed. Had to wait for transfusion to finish before starting Potassium phosphate which is a 6 hr infusion. Made MD aware, said Pt could still DC afterwards, everything done. Printed AVS. Will make night shift aware.

## 2019-05-14 NOTE — Discharge Summary (Signed)
Physician Discharge Summary  JENZIEL WINTERBERG [id number] DOB: 01/22/1966 DOA: 05/08/2019  PCP: Patient, No Pcp Per  Admit date: 05/08/2019 Discharge date: 05/14/2019  Admitted From: Home Disposition: Home  Recommendations for Outpatient Follow-up:  1. Follow up with PCP in 1-2 weeks 2. Please obtain BMP/CBC in one week 3. Please follow up on the following pending results:  Home Health: None Equipment/Devices: None  Discharge Condition: Stable CODE STATUS: Full code Diet recommendation: Renal  Subjective: Patient seen and examined.  Has no complaints.  Brief/Interim Summary: TroyMilleris a53 y.o.male,with past medical history of polycystic kidney disease, chronic kidney disease most recent creatinine was around 6 range 2 years ago but apparently patient does not follow with any physicians. Patient presented to ED secondary to complaints of nausea and vomiting, reportedly he had a seafood and steak about a week ago, has been feeling nauseated since, with poor oral intake, unable to eat or drink without nausea, as well he did report itching, lethargy, and not feeling well, he denied chest pain, shortness of breath, fever, chills but denied dysuria or polyuria. - in EDpatient noted to have creatinine of 25, BUN of 78, bicarb of 9, calcium of 4.9, mild leukocytosis at 14.5, lipase of 102. Admitted under [facility] with nephrology consultation and started on HD with right IJ catheter.  Patient completed vein mapping on 05/11/2019 and underwent left basilic vein AV fistula on 05/14/2019 by vascular surgery.  He was scheduled to receive outpatient dialysis on TTS schedule and has a place arranged.  Patient also had severe hypocalcemia at presentation and he was replaced with calcium gluconate.  He also had low phosphate which was also replaced.  He was cleared by nephrology today and since he is doing great and is hemodynamic stable so he will be discharged.  He is going to be discharged on calcitriol as  well as Neutra-Phos per nephrology recommendation for his hypocalcemia and hypophosphatemia respectively.  Of note, he also dropped his hemoglobin likely due to anemia of chronic disease and some blood loss during procedure.  His hemoglobin was 6.8 today.  He received 1 unit of PRBC transfusion before discharge.  Discharge Diagnoses:  Active Problems:   CKD (chronic kidney disease), stage IV (HCC)   AKI (acute kidney injury) ([place])   Acute renal failure (HCC)   Hypokalemia   Hypomagnesemia   Hypocalcemia   Anemia of chronic disease    Discharge Instructions  Discharge Instructions    Discharge patient   Complete by: As directed    Discharge disposition: 01-Home or Self Care   Discharge patient date: 05/14/2019     Allergies as of 05/14/2019   No Known Allergies     Medication List    TAKE these medications   acetaminophen 500 MG tablet Commonly known as: TYLENOL Take 1,000 mg by mouth every 6 (six) hours as needed for mild pain or moderate pain.   B-12 PO Take 1 tablet by mouth daily.   calcitRIOL 0.5 MCG capsule Commonly known as: ROCALTROL Take 2 capsules (1 mcg total) by mouth 2 (two) times daily.   Phospha 250 Neutral [phone] MG Tabs Take 1 tablet by mouth 2 (two) times daily for 7 days.      Follow-up Information    [facility] on 06/09/2019.   Why: 10:30 am with Dr. Ferdinand Lango information: [address] [place] [phone]       [facility] In 6 weeks.  Specialty: Vascular Surgery Why: Office will call you to arrange your appt (sent) Contact information: [place] [phone]         No Known Allergies  Consultations: Nephrology and vascular surgery   Procedures/Studies: US Renal  Result Date: 05/08/2019 CLINICAL DATA:  Chronic renal disease, stage IV. EXAM: RENAL / URINARY TRACT ULTRASOUND COMPLETE  COMPARISON:  November 19, 2016 FINDINGS: Right Kidney: Renal measurements: 16.8 x 6.8 x 6.8 = volume: 401 mL. Increased parenchymal echogenicity with prominent cortical thinning. More than 10 renal cysts are seen with benign appearance, measuring up to 3.5 cm. Left Kidney: Renal measurements: 19.0 by 8.7 x 8.5 cm = volume: 735 mL. Increased parenchymal echogenicity and prominent cortical thinning. More than 10 benign-appearing cysts are seen, the largest measuring 3.9 cm. Bladder: The bladder is decompressed around urinary Foley and therefore not well visualized. Incidental note of benign-appearing liver cysts. IMPRESSION: Bilateral increased renal parenchymal echogenicity with prominent cortical thinning. More than 10 in each kidney benign-appearing cysts. Electronically Signed   By: Fidela Salisbury M.D.   On: 05/08/2019 17:39   Dg Chest Port 1 View  Result Date: 05/14/2019 CLINICAL DATA:  Dialysis catheter insertion. EXAM: PORTABLE CHEST 1 VIEW COMPARISON:  05/08/2019. FINDINGS: Dialysis catheter noted with tip at cavoatrial junction. Heart size normal. Low lung volumes with mild bibasilar subsegmental atelectasis. No pleural effusion or pneumothorax. No acute bony abnormality. IMPRESSION: 1.  Dialysis catheter with tip at cavoatrial junction. 2.  Low lung volumes with mild bibasilar atelectasis. Electronically Signed   By: Marcello Moores  Register   On: 05/14/2019 13:29   Dg Chest Port 1 View  Result Date: 05/08/2019 CLINICAL DATA:  Central line placement EXAM: PORTABLE CHEST 1 VIEW COMPARISON:  11/19/2016 FINDINGS: Right-sided central venous catheter tip over the SVC. Negative for right pneumothorax. No focal airspace disease or effusion. Stable cardiomediastinal silhouette. IMPRESSION: Right-sided central venous catheter tip over the SVC. Negative for pneumothorax Electronically Signed   By: Donavan Foil M.D.   On: 05/08/2019 22:17   Dg Abd Acute 2+v W 1v Chest  Result Date: 05/08/2019 CLINICAL DATA:   Abdominal pain.  Intermittent nausea and vomiting. EXAM: DG ABDOMEN ACUTE W/ 1V CHEST COMPARISON:  No recent prior. FINDINGS: No acute cardiopulmonary disease. No bowel distention or free air thoracolumbar spine scoliosis. No acute bony abnormality. Pelvic calcifications consistent phleboliths. IMPRESSION: 1.  No acute cardiopulmonary disease. 2.  No acute intra-abdominal abnormality. Electronically Signed   By: Marcello Moores  Register   On: 05/08/2019 15:56   Dg Cyndy Freeze Guide Cv Line-no Report  Result Date: 05/14/2019 Fluoroscopy was utilized by the requesting physician.  No radiographic interpretation.   Vas [place] Upper Ext Vein Mapping (pre-op Avf)  Result Date: 05/12/2019 UPPER EXTREMITY VEIN MAPPING  Indications: Pre-access. History: Polycystic kidney disease.  Comparison Study: No prior. Performing Technologist: Oda Cogan RDMS, RVT  Examination Guidelines: A complete evaluation includes B-mode imaging, spectral Doppler, color Doppler, and power Doppler as needed of all accessible portions of each vessel. Bilateral testing is considered an integral part of a complete examination. Limited examinations for reoccurring indications may be performed as noted. +-----------------+-------------+----------+--------------+ Right Cephalic   Diameter (cm)Depth (cm)   Findings    +-----------------+-------------+----------+--------------+ Shoulder             0.28        1.00                  +-----------------+-------------+----------+--------------+ Prox upper arm  0.29        0.92                  +-----------------+-------------+----------+--------------+ Mid upper arm        0.20        0.28                  +-----------------+-------------+----------+--------------+ Dist upper arm       0.19        0.23                  +-----------------+-------------+----------+--------------+ Antecubital fossa    0.22        0.13                   +-----------------+-------------+----------+--------------+ Prox forearm         0.16        0.17                  +-----------------+-------------+----------+--------------+ Mid forearm          0.18        0.17                  +-----------------+-------------+----------+--------------+ Dist forearm         0.13        0.17                  +-----------------+-------------+----------+--------------+ Wrist                                   not visualized +-----------------+-------------+----------+--------------+ +-----------------+-------------+----------+--------------+ Right Basilic    Diameter (cm)Depth (cm)   Findings    +-----------------+-------------+----------+--------------+ Prox upper arm       0.55                              +-----------------+-------------+----------+--------------+ Mid upper arm        0.48                              +-----------------+-------------+----------+--------------+ Dist upper arm       0.35                 branching    +-----------------+-------------+----------+--------------+ Antecubital fossa    0.22                              +-----------------+-------------+----------+--------------+ Prox forearm         0.15                              +-----------------+-------------+----------+--------------+ Mid forearm          0.14                              +-----------------+-------------+----------+--------------+ Distal forearm                          not visualized +-----------------+-------------+----------+--------------+ Elbow                                   not visualized +-----------------+-------------+----------+--------------+ Wrist  not visualized +-----------------+-------------+----------+--------------+ +-----------------+-------------+----------+----------+ Left Cephalic    Diameter (cm)Depth (cm) Findings   +-----------------+-------------+----------+----------+ Shoulder             0.28        1.00              +-----------------+-------------+----------+----------+ Prox upper arm       0.17        0.59              +-----------------+-------------+----------+----------+ Mid upper arm        0.31        0.21   Thrombosed +-----------------+-------------+----------+----------+ Dist upper arm       0.38        0.13   Thrombosed +-----------------+-------------+----------+----------+ Antecubital fossa    0.27        0.16   Thrombosed +-----------------+-------------+----------+----------+ Prox forearm         0.27        0.19   Thrombosed +-----------------+-------------+----------+----------+ Mid forearm          0.29        0.24   Thrombosed +-----------------+-------------+----------+----------+ Dist forearm         0.17        0.17   Thrombosed +-----------------+-------------+----------+----------+ Wrist                0.16                          +-----------------+-------------+----------+----------+ +-----------------+-------------+----------+------------------------+ Left Basilic     Diameter (cm)Depth (cm)        Findings         +-----------------+-------------+----------+------------------------+ Prox upper arm       0.41                                        +-----------------+-------------+----------+------------------------+ Mid upper arm        0.32                                        +-----------------+-------------+----------+------------------------+ Dist upper arm       0.32               branching and Thrombosed +-----------------+-------------+----------+------------------------+ Antecubital fossa    0.32                      Thrombosed        +-----------------+-------------+----------+------------------------+ Prox forearm         0.20                                         +-----------------+-------------+----------+------------------------+ Mid forearm                                  not visualized      +-----------------+-------------+----------+------------------------+ Distal forearm                               not visualized      +-----------------+-------------+----------+------------------------+ Elbow  not visualized      +-----------------+-------------+----------+------------------------+ *See table(s) above for measurements and observations.  Diagnosing physician: Harold Barban MD Electronically signed by Harold Barban MD on 05/12/2019 at 8:54:29 PM.    Final      Discharge Exam: Vitals:   05/14/19 1458 05/14/19 1500  BP: 101/72 101/72  Pulse: (!) 108 (!) 106  Resp: 18 18  Temp: 98.5 F (36.9 C) 98.5 F (36.9 C)  SpO2: 100% 100%   Vitals:   05/14/19 1255 05/14/19 1321 05/14/19 1458 05/14/19 1500  BP: 93/75 95/75 101/72 101/72  Pulse: (!) 108 (!) 105 (!) 108 (!) 106  Resp:  16 18 18   Temp: 97.8 F (36.6 C) 97.8 F (36.6 C) 98.5 F (36.9 C) 98.5 F (36.9 C)  TempSrc: Oral Oral Oral Oral  SpO2: 100% 100% 100% 100%  Weight:      Height:        General: Pt is alert, awake, not in acute distress Cardiovascular: RRR, S1/S2 +, no rubs, no gallops Respiratory: CTA bilaterally, no wheezing, no rhonchi Abdominal: Soft, NT, ND, bowel sounds + Extremities: no edema, no cyanosis    The results of significant diagnostics from this hospitalization (including imaging, microbiology, ancillary and laboratory) are listed below for reference.     Microbiology: Recent Results (from the past 240 hour(s))  SARS CORONAVIRUS 2 Nasal Swab Aptima Multi Swab     Status: None   Collection Time: 05/08/19  2:36 PM   Specimen: Aptima Multi Swab; Nasal Swab  Result Value Ref Range Status   SARS Coronavirus 2 NEGATIVE NEGATIVE Final    Comment: (NOTE) SARS-CoV-2 target nucleic acids are NOT DETECTED. The  SARS-CoV-2 RNA is generally detectable in upper and lower respiratory specimens during the acute phase of infection. Negative results do not preclude SARS-CoV-2 infection, do not rule out co-infections with other pathogens, and should not be used as the sole basis for treatment or other patient management decisions. Negative results must be combined with clinical observations, patient history, and epidemiological information. The expected result is Negative. Fact Sheet for Patients: SugarRoll.be Fact Sheet for Healthcare Providers: https://www.woods-mathews.com/ This test is not yet approved or cleared by the [place] FDA and  has been authorized for detection and/or diagnosis of SARS-CoV-2 by FDA under an Emergency Use Authorization (EUA). This EUA will remain  in effect (meaning this test can be used) for the duration of the COVID-19 declaration under Section 56 4(b)(1) of the Act, 21 U.S.C. section 360bbb-3(b)(1), unless the authorization is terminated or revoked sooner. Performed at [facility], [place] [address] [place], [place] [address]   SARS Coronavirus 2 [facility] order, Performed in [facility] hospital lab) Nasopharyngeal Nasopharyngeal Swab     Status: None   Collection Time: 05/08/19  5:57 PM   Specimen: Nasopharyngeal Swab  Result Value Ref Range Status   SARS Coronavirus 2 NEGATIVE NEGATIVE Final    Comment: (NOTE) If result is NEGATIVE SARS-CoV-2 target nucleic acids are NOT DETECTED. The SARS-CoV-2 RNA is generally detectable in upper and lower  respiratory specimens during the acute phase of infection. The lowest  concentration of SARS-CoV-2 viral copies this assay can detect is 250  copies / mL. A negative result does not preclude SARS-CoV-2 infection  and should not be used as the sole basis for treatment or other  patient management decisions.  A negative result may occur with  improper specimen collection /  handling, submission of specimen other  than nasopharyngeal swab, presence of  viral mutation(s) within the  areas targeted by this assay, and inadequate number of viral copies  (<250 copies / mL). A negative result must be combined with clinical  observations, patient history, and epidemiological information. If result is POSITIVE SARS-CoV-2 target nucleic acids are DETECTED. The SARS-CoV-2 RNA is generally detectable in upper and lower  respiratory specimens dur ing the acute phase of infection.  Positive  results are indicative of active infection with SARS-CoV-2.  Clinical  correlation with patient history and other diagnostic information is  necessary to determine patient infection status.  Positive results do  not rule out bacterial infection or co-infection with other viruses. If result is PRESUMPTIVE POSTIVE SARS-CoV-2 nucleic acids MAY BE PRESENT.   A presumptive positive result was obtained on the submitted specimen  and confirmed on repeat testing.  While 2019 novel coronavirus  (SARS-CoV-2) nucleic acids may be present in the submitted sample  additional confirmatory testing may be necessary for epidemiological  and / or clinical management purposes  to differentiate between  SARS-CoV-2 and other Sarbecovirus currently known to infect humans.  If clinically indicated additional testing with an alternate test  methodology [phone]) is advised. The SARS-CoV-2 RNA is generally  detectable in upper and lower respiratory sp ecimens during the acute  phase of infection. The expected result is Negative. Fact Sheet for Patients:  StrictlyIdeas.no Fact Sheet for Healthcare Providers: BankingDealers.co.za This test is not yet approved or cleared by the [place] FDA and has been authorized for detection and/or diagnosis of SARS-CoV-2 by FDA under an Emergency Use Authorization (EUA).  This EUA will remain in effect (meaning this  test can be used) for the duration of the COVID-19 declaration under Section 564(b)(1) of the Act, 21 U.S.C. section 360bbb-3(b)(1), unless the authorization is terminated or revoked sooner. Performed at [facility], [place] [address], [place], [place] [address]   MRSA PCR Screening     Status: None   Collection Time: 05/08/19  9:03 PM   Specimen: Nasopharyngeal  Result Value Ref Range Status   MRSA by PCR NEGATIVE NEGATIVE Final    Comment:        The GeneXpert MRSA Assay (FDA approved for NASAL specimens only), is one component of a comprehensive MRSA colonization surveillance program. It is not intended to diagnose MRSA infection nor to guide or monitor treatment for MRSA infections. Performed at [facility], [place] [address], [place], [place] [address]      Labs: BNP (last 3 results) No results for input(s): BNP in the last 8760 hours. Basic Metabolic Panel: Recent Labs  Lab 05/10/19 0446 05/11/19 0419 05/11/19 1730 05/12/19 0521 05/13/19 0523 05/14/19 0430  NA 139 138  --  137 141 137  K 2.8* 2.9*  --  3.4* 3.5 3.7  CL 101 103  --  102 109 104  CO2 17* 21*  --  23 23 23   GLUCOSE 105* 105*  --  95 98 89  BUN 111* 49*  --  23* 33* 14  CREATININE 15.06* 8.00*  --  4.42* 5.69* 3.47*  CALCIUM 5.4* 6.1*  --  6.6* 6.4* 6.7*  MG 1.5*  --  1.7  --   --   --   PHOS 5.2* 2.1*  --  1.3* 1.1* 1.6*   Liver Function Tests: Recent Labs  Lab 05/08/19 1320  05/09/19 0400 05/10/19 0446 05/11/19 0419 05/12/19 0521 05/13/19 0523  AST 32  --   --  32  --   --   --  ALT 43  --   --  33  --   --   --   ALKPHOS 56  --   --  41  --   --   --   BILITOT 1.0  --   --  0.7  --   --   --   PROT 8.7*  --   --  6.4*  --   --   --   ALBUMIN 3.4*   < > 2.9* 2.6* 2.4* 2.3* 2.3*   < > = values in this interval not displayed.   Recent Labs  Lab 05/08/19 1320  LIPASE 102*   No results for input(s): AMMONIA in the last 168 hours. CBC: Recent Labs  Lab 05/09/19 0400  05/10/19 0446 05/11/19 1334 05/13/19 0843 05/14/19 0430  WBC 13.6* 10.4 9.8 12.1* 11.5*  NEUTROABS  --  7.7 6.9  --   --   HGB 8.0* 7.4* 7.4* 7.0* 6.8*  HCT 23.2* 23.1* 23.2* 22.7* 22.7*  MCV 88.2 92.8 95.9 99.1 101.8*  PLT 217 198 191 207 165   Cardiac Enzymes: No results for input(s): CKTOTAL, CKMB, CKMBINDEX, TROPONINI in the last 168 hours. BNP: Invalid input(s): POCBNP CBG: Recent Labs  Lab 05/12/19 2156  GLUCAP 97   D-Dimer No results for input(s): DDIMER in the last 72 hours. Hgb A1c No results for input(s): HGBA1C in the last 72 hours. Lipid Profile No results for input(s): CHOL, HDL, LDLCALC, TRIG, CHOLHDL, LDLDIRECT in the last 72 hours. Thyroid function studies No results for input(s): TSH, T4TOTAL, T3FREE, THYROIDAB in the last 72 hours.  Invalid input(s): FREET3 Anemia work up No results for input(s): VITAMINB12, FOLATE, FERRITIN, TIBC, IRON, RETICCTPCT in the last 72 hours. Urinalysis    Component Value Date/Time   COLORURINE YELLOW 05/08/2019 1435   APPEARANCEUR HAZY (A) 05/08/2019 1435   LABSPEC 1.011 05/08/2019 1435   PHURINE 7.0 05/08/2019 1435   GLUCOSEU NEGATIVE 05/08/2019 1435   HGBUR MODERATE (A) 05/08/2019 1435   BILIRUBINUR NEGATIVE 05/08/2019 1435   KETONESUR NEGATIVE 05/08/2019 1435   PROTEINUR 30 (A) 05/08/2019 1435   NITRITE NEGATIVE 05/08/2019 1435   LEUKOCYTESUR LARGE (A) 05/08/2019 1435   Sepsis Labs Invalid input(s): PROCALCITONIN,  WBC,  LACTICIDVEN Microbiology Recent Results (from the past 240 hour(s))  SARS CORONAVIRUS 2 Nasal Swab Aptima Multi Swab     Status: None   Collection Time: 05/08/19  2:36 PM   Specimen: Aptima Multi Swab; Nasal Swab  Result Value Ref Range Status   SARS Coronavirus 2 NEGATIVE NEGATIVE Final    Comment: (NOTE) SARS-CoV-2 target nucleic acids are NOT DETECTED. The SARS-CoV-2 RNA is generally detectable in upper and lower respiratory specimens during the acute phase of infection.  Negative results do not preclude SARS-CoV-2 infection, do not rule out co-infections with other pathogens, and should not be used as the sole basis for treatment or other patient management decisions. Negative results must be combined with clinical observations, patient history, and epidemiological information. The expected result is Negative. Fact Sheet for Patients: SugarRoll.be Fact Sheet for Healthcare Providers: https://www.woods-mathews.com/ This test is not yet approved or cleared by the [place] FDA and  has been authorized for detection and/or diagnosis of SARS-CoV-2 by FDA under an Emergency Use Authorization (EUA). This EUA will remain  in effect (meaning this test can be used) for the duration of the COVID-19 declaration under Section 56 4(b)(1) of the Act, 21 U.S.C. section 360bbb-3(b)(1), unless the authorization is terminated or revoked sooner. Performed  at [facility], [place] [address], [place], [place] [address]   SARS Coronavirus 2 [facility] order, Performed in [facility] hospital lab) Nasopharyngeal Nasopharyngeal Swab     Status: None   Collection Time: 05/08/19  5:57 PM   Specimen: Nasopharyngeal Swab  Result Value Ref Range Status   SARS Coronavirus 2 NEGATIVE NEGATIVE Final    Comment: (NOTE) If result is NEGATIVE SARS-CoV-2 target nucleic acids are NOT DETECTED. The SARS-CoV-2 RNA is generally detectable in upper and lower  respiratory specimens during the acute phase of infection. The lowest  concentration of SARS-CoV-2 viral copies this assay can detect is 250  copies / mL. A negative result does not preclude SARS-CoV-2 infection  and should not be used as the sole basis for treatment or other  patient management decisions.  A negative result may occur with  improper specimen collection / handling, submission of specimen other  than nasopharyngeal swab, presence of viral mutation(s) within the  areas  targeted by this assay, and inadequate number of viral copies  (<250 copies / mL). A negative result must be combined with clinical  observations, patient history, and epidemiological information. If result is POSITIVE SARS-CoV-2 target nucleic acids are DETECTED. The SARS-CoV-2 RNA is generally detectable in upper and lower  respiratory specimens dur ing the acute phase of infection.  Positive  results are indicative of active infection with SARS-CoV-2.  Clinical  correlation with patient history and other diagnostic information is  necessary to determine patient infection status.  Positive results do  not rule out bacterial infection or co-infection with other viruses. If result is PRESUMPTIVE POSTIVE SARS-CoV-2 nucleic acids MAY BE PRESENT.   A presumptive positive result was obtained on the submitted specimen  and confirmed on repeat testing.  While 2019 novel coronavirus  (SARS-CoV-2) nucleic acids may be present in the submitted sample  additional confirmatory testing may be necessary for epidemiological  and / or clinical management purposes  to differentiate between  SARS-CoV-2 and other Sarbecovirus currently known to infect humans.  If clinically indicated additional testing with an alternate test  methodology [phone]) is advised. The SARS-CoV-2 RNA is generally  detectable in upper and lower respiratory sp ecimens during the acute  phase of infection. The expected result is Negative. Fact Sheet for Patients:  StrictlyIdeas.no Fact Sheet for Healthcare Providers: BankingDealers.co.za This test is not yet approved or cleared by the [place] FDA and has been authorized for detection and/or diagnosis of SARS-CoV-2 by FDA under an Emergency Use Authorization (EUA).  This EUA will remain in effect (meaning this test can be used) for the duration of the COVID-19 declaration under Section 564(b)(1) of the Act, 21 U.S.C. section  360bbb-3(b)(1), unless the authorization is terminated or revoked sooner. Performed at [facility], [place] [address], [place], [place] [address]   MRSA PCR Screening     Status: None   Collection Time: 05/08/19  9:03 PM   Specimen: Nasopharyngeal  Result Value Ref Range Status   MRSA by PCR NEGATIVE NEGATIVE Final    Comment:        The GeneXpert MRSA Assay (FDA approved for NASAL specimens only), is one component of a comprehensive MRSA colonization surveillance program. It is not intended to diagnose MRSA infection nor to guide or monitor treatment for MRSA infections. Performed at [facility], [place] [address] [place], [place] [address]      Time coordinating discharge: Over 30 minutes  SIGNED:   Einar Grad  Johnell Landowski, MD  [facility] 05/14/2019, 3:37 PM Pager [phone]  If 7PM-7AM, please contact night-coverage [email or web address] Password [email or web address]

## 2019-05-15 ENCOUNTER — Encounter (HOSPITAL_COMMUNITY): Payer: Self-pay | Admitting: Vascular Surgery

## 2019-05-15 LAB — TYPE AND SCREEN
ABO/RH(D): A POS
Antibody Screen: NEGATIVE
Unit division: 0

## 2019-05-15 LAB — BPAM RBC
Blood Product Expiration Date: 202008172359
ISSUE DATE / TIME: 202008131249
Unit Type and Rh: 6200

## 2019-05-15 LAB — CBC WITH DIFFERENTIAL/PLATELET
Abs Immature Granulocytes: 0.2 10*3/uL — ABNORMAL HIGH (ref 0.00–0.07)
Basophils Absolute: 0 10*3/uL (ref 0.0–0.1)
Basophils Relative: 0 %
Eosinophils Absolute: 0.9 10*3/uL — ABNORMAL HIGH (ref 0.0–0.5)
Eosinophils Relative: 7 %
HCT: 27.7 % — ABNORMAL LOW (ref 39.0–52.0)
Hemoglobin: 8.3 g/dL — ABNORMAL LOW (ref 13.0–17.0)
Immature Granulocytes: 2 %
Lymphocytes Relative: 9 %
Lymphs Abs: 1.2 10*3/uL (ref 0.7–4.0)
MCH: 29.6 pg (ref 26.0–34.0)
MCHC: 30 g/dL (ref 30.0–36.0)
MCV: 98.9 fL (ref 80.0–100.0)
Monocytes Absolute: 1.2 10*3/uL — ABNORMAL HIGH (ref 0.1–1.0)
Monocytes Relative: 9 %
Neutro Abs: 9.6 10*3/uL — ABNORMAL HIGH (ref 1.7–7.7)
Neutrophils Relative %: 73 %
Platelets: 186 10*3/uL (ref 150–400)
RBC: 2.8 MIL/uL — ABNORMAL LOW (ref 4.22–5.81)
RDW: 18 % — ABNORMAL HIGH (ref 11.5–15.5)
WBC: 13.2 10*3/uL — ABNORMAL HIGH (ref 4.0–10.5)
nRBC: 0.2 % (ref 0.0–0.2)

## 2019-05-15 NOTE — Progress Notes (Signed)
CSW provided taxi voucher for patient. He reports he has a car at home and is aware he begins outpatient dialysis on Saturday. CSW reiterated that he needs to go to [place] [facility] today to complete paperwork before his first treatment. He expressed understanding.   Percell Locus Jasleen Riepe LCSW [phone]

## 2019-05-15 NOTE — Progress Notes (Signed)
  Progress Note    05/15/2019 10:30 AM 1 Day Post-Op  Subjective: ready for discharge home.  Denies signs or symptoms of steal L hand   Vitals:   05/14/19 2023 05/15/19 0455  BP: 96/68 100/84  Pulse: (!) 116 (!) 103  Resp: 16 16  Temp: 99 F (37.2 C) 98.8 F (37.1 C)  SpO2: 100% 100%   Physical Exam: Lungs:  Non labored Incisions:  L antecubitum incisions c/d/i Extremities:  Soft thrill near anastomosis; palpable L radial pulse Neurologic: A&O  CBC    Component Value Date/Time   WBC 11.5 (H) 05/14/2019 0430   RBC 2.23 (L) 05/14/2019 0430   HGB 6.8 (LL) 05/14/2019 0430   HCT 22.7 (L) 05/14/2019 0430   PLT 165 05/14/2019 0430   MCV 101.8 (H) 05/14/2019 0430   MCH 30.5 05/14/2019 0430   MCHC 30.0 05/14/2019 0430   RDW 15.2 05/14/2019 0430   LYMPHSABS 1.0 05/11/2019 1334   MONOABS 1.0 05/11/2019 1334   EOSABS 0.7 (H) 05/11/2019 1334   BASOSABS 0.0 05/11/2019 1334    BMET    Component Value Date/Time   NA 137 05/14/2019 0430   K 3.7 05/14/2019 0430   CL 104 05/14/2019 0430   CO2 23 05/14/2019 0430   GLUCOSE 89 05/14/2019 0430   BUN 14 05/14/2019 0430   CREATININE 3.47 (H) 05/14/2019 0430   CALCIUM 6.7 (L) 05/14/2019 0430   GFRNONAA 19 (L) 05/14/2019 0430   GFRAA 22 (L) 05/14/2019 0430    INR    Component Value Date/Time   INR 1.2 05/14/2019 0430     Intake/Output Summary (Last 24 hours) at 05/15/2019 1030 Last data filed at 05/15/2019 1014 Gross per 24 hour  Intake 2369.24 ml  Output 930 ml  Net 1439.24 ml     Assessment/Plan:  53 y.o. male is s/p L 1st stage basilic fistula and TDC 1 Day Post-Op   Patent fistula LUE with soft thrill TDC site unremarkable Check fistula duplex in 6 weeks Ok for discharge from vascular standpoint   Dagoberto Ligas, PA-C [facility] [phone] 05/15/2019 10:30 AM

## 2019-05-15 NOTE — Progress Notes (Signed)
patient denies to go home in night time. He states his relative will come to pick up him in the morning. He will be here for night.

## 2019-05-15 NOTE — Progress Notes (Signed)
Patient seen and examined.  Patient was discharged yesterday however he had to have blood transfusion and some electrolyte replacement which took several hours.  This was completed in the night shift and patient was aware that he could go home but he could not go home since there was no one to pick him up.  Patient ended up staying overnight.  He has no complaints.  There is no change in his medical status since yesterday.  Abdomen soft and nontender.  He tells me that he has called his friend to pick him up but he is not aware what time she is coming.  While he is here and he received 1 unit of PRBC yesterday so I will go ahead and check a CBC today and transfuse if hemoglobin less than 7.

## 2019-05-15 NOTE — Progress Notes (Signed)
Colon Flattery to be D/C'd per MD order. Discussed with the patient and all questions fully answered. ? VSS, Skin clean, dry and intact without evidence of skin break down, no evidence of skin tears noted. ? IV catheter discontinued intact. Site without signs and symptoms of complications. Dressing and pressure applied. ? An After Visit Summary was printed and given to the patient. Patient informed where to pickup prescriptions. ? D/c education completed with patient/family including follow up instructions, medication list, d/c activities limitations if indicated, with other d/c instructions as indicated by MD - patient able to verbalize understanding, all questions fully answered.  ? Patient instructed to return to ED, call 911, or call MD for any changes in condition.  ? Patient to be escorted via [place], and D/C home via [facility].

## 2019-05-15 NOTE — Progress Notes (Signed)
Patient's MEWS is yellow due to SBP and HR. These are not acute changes for the patient and he is due for imminent discharge. Will obtain new set of vitals prior to patient leaving.

## 2019-06-08 NOTE — Progress Notes (Signed)
Subjective:    Patient ID: Bernard Jordan, male    DOB: 12-10-1965, 53 y.o.   MRN: [id number]  This is a pleasant African-American male who is seen to establish for primary care.  The patient denies that he has had previous primary care however he was seen in [facility] for chronic renal insufficiency in the past.  He does have history of polycystic kidney disease and his baseline creatinine 2 years ago was 6.  The patient came to the emergency room in August with increased malaise nausea and vomiting and advanced uremia.  His creatinine was 25 calcium was down to 4.9 lipase 102 and he had mild leukocytosis.  Discharge summary is as below  Admit date: 05/08/2019 Discharge date: 05/14/2019  Admitted From: Home Disposition: Home  Recommendations for Outpatient Follow-up:  1. Follow up with PCP in 1-2 weeks 2. Please obtain BMP/CBC in one week 3. Please follow up on the following pending results:  Home Health: None Equipment/Devices: None  Discharge Condition: Stable CODE STATUS: Full code Diet recommendation: Renal  Subjective: Patient seen and examined.  Has no complaints.  Brief/Interim Summary: Bernard Jordan a53 y.o.male,with past medical history of polycystic kidney disease, chronic kidney disease most recent creatinine was around 6 range 2 years ago but apparently patient does not follow with any physicians. Patient presented to ED secondary to complaints of nausea and vomiting, reportedly he had a seafood and steak about a week ago, has been feeling nauseated since, with poor oral intake, unable to eat or drink without nausea, as well he did report itching, lethargy, and not feeling well, he denied chest pain, shortness of breath, fever, chills but denied dysuria or polyuria. - in EDpatient noted to have creatinine of 25, BUN of 78, bicarb of 9, calcium of 4.9, mild leukocytosis at 14.5, lipase of 102. Admitted under [facility] with nephrology consultation and started on HD with  right IJ catheter. Patient completed vein mapping on 05/11/2019 and underwent left basilic vein AV fistula on 05/14/2019 by vascular surgery.  He was scheduled to receive outpatient dialysis on TTS schedule and has a place arranged.  Patient also had severe hypocalcemia at presentation and he was replaced with calcium gluconate.  He also had low phosphate which was also replaced.  He was cleared by nephrology today and since he is doing great and is hemodynamic stable so he will be discharged.  He is going to be discharged on calcitriol as well as Neutra-Phos per nephrology recommendation for his hypocalcemia and hypophosphatemia respectively.  Of note, he also dropped his hemoglobin likely due to anemia of chronic disease and some blood loss during procedure.  His hemoglobin was 6.8 today.  He received 1 unit of PRBC transfusion before discharge.  Discharge Diagnoses:  Active Problems:   CKD (chronic kidney disease), stage IV (HCC)   AKI (acute kidney injury) ([place])   Acute renal failure (HCC)   Hypokalemia   Hypomagnesemia   Hypocalcemia   Anemia of chronic disease  The patient is now at the [facility] kidney Center being dialyzed Tuesday Thursday Saturdays.  He has a tunneled catheter in the right IJ.  He also has a fistula that is slowly maturing in the left arm.  The patient's calcium and phosphorus have been repleted and he was supposed to be taking Neutra-Phos per nephrology but he tells me all of his medicines were administered intravenously at the dialysis center and he is on no oral medications at this time.  He was also  given a prescription for oral calcitriol on discharge but he states he is not using that at this time.   [facility]  Doing ok with TTS HD.     Past Medical History:  Diagnosis Date  . AKI (acute kidney injury) ([place]) 05/08/2019  . CKD (chronic kidney disease), stage IV ([place]) 05/08/2019  . Polycystic kidney disease      Family History  Problem Relation Age of Onset  .  Renal Disease Mother        gets iHD     Social History   Socioeconomic History  . Marital status: Married    Spouse name: Not on file  . Number of children: Not on file  . Years of education: Not on file  . Highest education level: Not on file  Occupational History  . Not on file  Social Needs  . Financial resource strain: Not on file  . Food insecurity    Worry: Not on file    Inability: Not on file  . Transportation needs    Medical: Not on file    Non-medical: Not on file  Tobacco Use  . Smoking status: Never Smoker  . Smokeless tobacco: Never Used  Substance and Sexual Activity  . Alcohol use: Not Currently  . Drug use: Never  . Sexual activity: Not Currently  Lifestyle  . Physical activity    Days per week: Not on file    Minutes per session: Not on file  . Stress: Not on file  Relationships  . Social Herbalist on phone: Not on file    Gets together: Not on file    Attends religious service: Not on file    Active member of club or organization: Not on file    Attends meetings of clubs or organizations: Not on file    Relationship status: Not on file  . Intimate partner violence    Fear of current or ex partner: Not on file    Emotionally abused: Not on file    Physically abused: Not on file    Forced sexual activity: Not on file  Other Topics Concern  . Not on file  Social History Narrative  . Not on file     No Known Allergies   Outpatient Medications Prior to Visit  Medication Sig Dispense Refill  . acetaminophen (TYLENOL) 500 MG tablet Take 1,000 mg by mouth every 6 (six) hours as needed for mild pain or moderate pain.    . calcitRIOL (ROCALTROL) 0.5 MCG capsule Take 2 capsules (1 mcg total) by mouth 2 (two) times daily. 120 capsule 0  . Cyanocobalamin (B-12 PO) Take 1 tablet by mouth daily.     No facility-administered medications prior to visit.     Review of Systems Constitutional:   No  weight loss, night sweats,  Fevers,  chills, fatigue, lassitude. HEENT:   No headaches,  Difficulty swallowing,  Tooth/dental problems,  Sore throat,                No sneezing, itching, ear ache, nasal congestion, post nasal drip,   CV:  No chest pain,  Orthopnea, PND, swelling in lower extremities, anasarca, dizziness, palpitations  GI  No heartburn, indigestion, abdominal pain, nausea, vomiting, diarrhea, change in bowel habits, loss of appetite  Resp: No shortness of breath with exertion or at rest.  No excess mucus, no productive cough,  No non-productive cough,  No coughing up of blood.  No change in color  of mucus.  No wheezing.  No chest wall deformity  Skin: no rash or lesions.  GU: no dysuria, change in color of urine, no urgency or frequency.  No flank pain.  MS:  No joint pain or swelling.  No decreased range of motion.  No back pain.  Psych:  No change in mood or affect. No depression or anxiety.  No memory loss.     Objective:   Physical Exam Vitals:   06/09/19 1044  BP: 105/71  Pulse: (!) 106  Temp: 98.6 F (37 C)  TempSrc: Oral  SpO2: 99%  Weight: 208 lb 12.8 oz (94.7 kg)  Height: 5\' 11"  (1.803 m)    Gen: Pleasant, well-nourished, in no distress,  normal affect  ENT: No lesions,  mouth clear,  oropharynx clear, no postnasal drip  Neck: No JVD, no TMG, no carotid bruits  Lungs: No use of accessory muscles, no dullness to percussion, clear without rales or rhonchi  Cardiovascular: RRR, heart sounds normal, no murmur or gallops, no peripheral edema, left arm fistula is slowly maturing and there is a palpable thrill, there is a right tunneled IJ catheter with a clean site  Abdomen: soft and NT, no HSM,  BS normal  Musculoskeletal: No deformities, no cyanosis or clubbing  Neuro: alert, non focal  Skin: Warm, no lesions or rashes  All labs in epic system reviewed from the previous hospitalization    Assessment & Plan:  I personally reviewed all images and lab data in the [facility] system as well  as any outside material available during this office visit and agree with the  radiology impressions.   ESRD (end stage renal disease) ([place]) End-stage renal disease now with polycystic kidney disease now on dialysis 3 times weekly at the [facility] kidney [place] will be to obtain records from the [facility] to see what his current labs are and what medications he is receiving  Need to clarify if he is on oral Neutra-Phos  roCalcitrol  Anemia of chronic disease Anemia of chronic disease likely receiving erythropoietin type agent in the kidney center will try to follow-up with his labs that have been obtained at the kidney center  Hypocalcemia History of hypocalcemia hypokalemia and hypomagnesemia likely being monitored by nephrology  We will follow-up to see if we can obtain the labs from the kidney center   Kamai was seen today for hospitalization follow-up.  Diagnoses and all orders for this visit:  ESRD (end stage renal disease) ([place])  Encounter for screening for HIV -     HIV Antibody (routine testing w rflx)  Colon cancer screening -     Fecal occult blood, imunochemical  Hypocalcemia  Hypomagnesemia  Polycystic kidney disease  Anemia of chronic disease  Plan will be for the patient to obtain an HIV study fecal occult blood today also administer tetanus vaccine and flu vaccine at this visit

## 2019-06-09 ENCOUNTER — Other Ambulatory Visit: Payer: Self-pay

## 2019-06-09 ENCOUNTER — Encounter: Payer: Self-pay | Admitting: Critical Care Medicine

## 2019-06-09 ENCOUNTER — Ambulatory Visit: Payer: Self-pay | Attending: Critical Care Medicine | Admitting: Critical Care Medicine

## 2019-06-09 VITALS — BP 105/71 | HR 106 | Temp 98.6°F | Ht 71.0 in | Wt 208.8 lb

## 2019-06-09 DIAGNOSIS — N186 End stage renal disease: Secondary | ICD-10-CM | POA: Insufficient documentation

## 2019-06-09 DIAGNOSIS — Q613 Polycystic kidney, unspecified: Secondary | ICD-10-CM

## 2019-06-09 DIAGNOSIS — Z992 Dependence on renal dialysis: Secondary | ICD-10-CM | POA: Insufficient documentation

## 2019-06-09 DIAGNOSIS — Z114 Encounter for screening for human immunodeficiency virus [HIV]: Secondary | ICD-10-CM

## 2019-06-09 DIAGNOSIS — D638 Anemia in other chronic diseases classified elsewhere: Secondary | ICD-10-CM

## 2019-06-09 DIAGNOSIS — Z23 Encounter for immunization: Secondary | ICD-10-CM

## 2019-06-09 DIAGNOSIS — Z1211 Encounter for screening for malignant neoplasm of colon: Secondary | ICD-10-CM

## 2019-06-09 NOTE — Patient Instructions (Signed)
No change in medications  A tetanus vaccine and flu shot were given  We will give you a stool card to mail back in a stool sample to check for blood in the stool which is a colon cancer screen  We will have you obtain an HIV study today  We will ask you to sign a record release so that we can see records from your hemodialysis center including her labs  Return to establish with primary care in 2 months

## 2019-06-09 NOTE — Assessment & Plan Note (Signed)
Anemia of chronic disease likely receiving erythropoietin type agent in the kidney center will try to follow-up with his labs that have been obtained at the kidney center

## 2019-06-09 NOTE — Assessment & Plan Note (Signed)
End-stage renal disease now with polycystic kidney disease now on dialysis 3 times weekly at the [facility] kidney [place] will be to obtain records from the [facility] to see what his current labs are and what medications he is receiving  Need to clarify if he is on oral Neutra-Phos  roCalcitrol

## 2019-06-09 NOTE — Assessment & Plan Note (Signed)
History of hypocalcemia hypokalemia and hypomagnesemia likely being monitored by nephrology  We will follow-up to see if we can obtain the labs from the kidney center

## 2019-06-09 NOTE — Addendum Note (Signed)
Addended by: Emilio Aspen A on: 06/09/2019 12:31 PM   Modules accepted: Orders

## 2019-06-10 LAB — HIV ANTIBODY (ROUTINE TESTING W REFLEX): HIV Screen 4th Generation wRfx: NONREACTIVE

## 2019-06-18 ENCOUNTER — Encounter: Payer: Self-pay | Admitting: *Deleted

## 2019-06-22 ENCOUNTER — Other Ambulatory Visit: Payer: Self-pay

## 2019-06-22 DIAGNOSIS — N186 End stage renal disease: Secondary | ICD-10-CM

## 2019-06-23 ENCOUNTER — Ambulatory Visit (HOSPITAL_COMMUNITY)
Admission: RE | Admit: 2019-06-23 | Discharge: 2019-06-23 | Disposition: A | Discharge status: Home / Self Care | Payer: Medicaid Other | Source: Ambulatory Visit | Attending: Vascular Surgery | Admitting: Vascular Surgery

## 2019-06-23 ENCOUNTER — Encounter: Payer: Self-pay | Admitting: *Deleted

## 2019-06-23 ENCOUNTER — Other Ambulatory Visit: Payer: Self-pay

## 2019-06-23 ENCOUNTER — Ambulatory Visit (INDEPENDENT_AMBULATORY_CARE_PROVIDER_SITE_OTHER): Payer: Self-pay | Admitting: Physician Assistant

## 2019-06-23 VITALS — BP 90/57 | HR 78 | Temp 98.0°F | Resp 16 | Ht 71.0 in | Wt 206.8 lb

## 2019-06-23 DIAGNOSIS — N186 End stage renal disease: Secondary | ICD-10-CM

## 2019-06-23 NOTE — H&P (View-Only) (Signed)
    Established Dialysis Access   History of Present Illness   Bernard Jordan is a 53 y.o. ([birth year]) male who is status post left arm first stage basilic vein transposition by Dr. early on 05/14/2019.  Patient denies any signs or symptoms of steal syndrome in left hand.  He is dialyzing on a Tuesday Thursday Saturday schedule via right IJ TDC without complication.  Patient is aware that he will require a second stage of surgery.  He would be happy to proceed with second stage surgery however this will have to be scheduled for after his mother's funeral on 06/30/2019.  The patient's PMH, PSH, SH, and FamHx were reviewed and are unchanged from prior visit.  Current Outpatient Medications  Medication Sig Dispense Refill  . acetaminophen (TYLENOL) 500 MG tablet Take 1,000 mg by mouth every 6 (six) hours as needed for mild pain or moderate pain.    . Cyanocobalamin (B-12 PO) Take 1 tablet by mouth daily.     No current facility-administered medications for this visit.     On ROS today: 10 system ROS is negative unless otherwise noted in HPI   Physical Examination   Vitals:   06/23/19 1338  BP: (!) 90/57  Pulse: 78  Resp: 16  Temp: 98 F (36.7 C)  TempSrc: Temporal  SpO2: 98%  Weight: 206 lb 12.8 oz (93.8 kg)  Height: 5\' 11"  (1.803 m)   Body mass index is 28.84 kg/m.  General Alert, O x 3, WD, NAD  Pulmonary Sym exp, good B air movt, CTA B  Cardiac RRR, Nl S1, S2  Vascular Vessel Right Left  Radial Palpable Palpable  Brachial Palpable Palpable  Ulnar Not palpable Not palpable    Musculo- skeletal M/S 5/5 throughout  , Extremities without ischemic changes    Neurologic A&O; CN grossly intact     Non-invasive Vascular Imaging   left Arm Access Duplex  (06/23/19):   Diameters:  >6 mm  Depth:  10 mm  PSV:  100 c/s    Medical Decision Making   Bernard Jordan is a 53 y.o. male who presents with ESRD requiring hemodialysis.    Basilic vein AV fistula appears  mature enough to proceed with a second stage transposition  This will be performed by Dr. Donnetta Hutching on a Monday Wednesday or Friday next month Risk, benefits, and alternatives to access surgery were discussed.   The patient is aware the risks include but are not limited to: bleeding, infection, steal syndrome, nerve damage, thrombosis, failure to mature, and need for additional procedures.   The patient agrees to proceed with the procedure.   Dagoberto Ligas PA-C [facility] [place] Office: [phone]  Clinic MD: Dr. Donnetta Hutching

## 2019-06-23 NOTE — Progress Notes (Signed)
    Established Dialysis Access   History of Present Illness   Bernard Jordan is a 53 y.o. (July 07, 1966) male who is status post left arm first stage basilic vein transposition by Dr. early on 05/14/2019.  Patient denies any signs or symptoms of steal syndrome in left hand.  He is dialyzing on a Tuesday Thursday Saturday schedule via right IJ TDC without complication.  Patient is aware that he will require a second stage of surgery.  He would be happy to proceed with second stage surgery however this will have to be scheduled for after his mother's funeral on 06/30/2019.  The patient's PMH, PSH, SH, and FamHx were reviewed and are unchanged from prior visit.  Current Outpatient Medications  Medication Sig Dispense Refill  . acetaminophen (TYLENOL) 500 MG tablet Take 1,000 mg by mouth every 6 (six) hours as needed for mild pain or moderate pain.    . Cyanocobalamin (B-12 PO) Take 1 tablet by mouth daily.     No current facility-administered medications for this visit.     On ROS today: 10 system ROS is negative unless otherwise noted in HPI   Physical Examination   Vitals:   06/23/19 1338  BP: (!) 90/57  Pulse: 78  Resp: 16  Temp: 98 F (36.7 C)  TempSrc: Temporal  SpO2: 98%  Weight: 206 lb 12.8 oz (93.8 kg)  Height: 5\' 11"  (1.803 m)   Body mass index is 28.84 kg/m.  General Alert, O x 3, WD, NAD  Pulmonary Sym exp, good B air movt, CTA B  Cardiac RRR, Nl S1, S2  Vascular Vessel Right Left  Radial Palpable Palpable  Brachial Palpable Palpable  Ulnar Not palpable Not palpable    Musculo- skeletal M/S 5/5 throughout  , Extremities without ischemic changes    Neurologic A&O; CN grossly intact     Non-invasive Vascular Imaging   left Arm Access Duplex  (06/23/19):   Diameters:  >6 mm  Depth:  10 mm  PSV:  100 c/s    Medical Decision Making   Bernard Jordan is a 53 y.o. male who presents with ESRD requiring hemodialysis.    Basilic vein AV fistula appears  mature enough to proceed with a second stage transposition  This will be performed by Dr. Donnetta Hutching on a Monday Wednesday or Friday next month Risk, benefits, and alternatives to access surgery were discussed.   The patient is aware the risks include but are not limited to: bleeding, infection, steal syndrome, nerve damage, thrombosis, failure to mature, and need for additional procedures.   The patient agrees to proceed with the procedure.   Dagoberto Ligas PA-C [facility] [place] Office: [phone]  Clinic MD: Dr. Donnetta Hutching

## 2019-06-29 ENCOUNTER — Telehealth: Payer: Self-pay | Admitting: *Deleted

## 2019-06-29 NOTE — Telephone Encounter (Signed)
Left a message on voice mail and called [facility]. Time change for arrival for 10/50/2020 surgery at [facility] to 7:45 am or as directed by hospital preadmission department.

## 2019-07-03 ENCOUNTER — Encounter (HOSPITAL_COMMUNITY): Payer: Self-pay | Admitting: *Deleted

## 2019-07-03 ENCOUNTER — Other Ambulatory Visit: Payer: Self-pay

## 2019-07-03 ENCOUNTER — Other Ambulatory Visit (HOSPITAL_COMMUNITY)
Admission: RE | Admit: 2019-07-03 | Discharge: 2019-07-03 | Disposition: A | Discharge status: Home / Self Care | Payer: Medicaid Other | Source: Ambulatory Visit | Attending: Vascular Surgery | Admitting: Vascular Surgery

## 2019-07-03 DIAGNOSIS — Z20828 Contact with and (suspected) exposure to other viral communicable diseases: Secondary | ICD-10-CM | POA: Diagnosis not present

## 2019-07-03 DIAGNOSIS — Z01812 Encounter for preprocedural laboratory examination: Secondary | ICD-10-CM | POA: Insufficient documentation

## 2019-07-03 NOTE — Progress Notes (Signed)
Pt denies SOB, chest pain, and being under the care of a cardiologist. Pt denies having a a stress test and cardiac cath. Pt made aware to stop taking vitamins, fish oil and herbal medications. Do not take any NSAIDs ie: Ibuprofen, Advil, Naproxen (Aleve), Motrin, BC and Goody Powder. Pt verbalized understanding of all pre-op instructions.  PA, Anesthesiology, asked to review pt EKG.

## 2019-07-05 LAB — NOVEL CORONAVIRUS, NAA (HOSP ORDER, SEND-OUT TO REF LAB; TAT 18-24 HRS): SARS-CoV-2, NAA: NOT DETECTED

## 2019-07-06 ENCOUNTER — Ambulatory Visit (HOSPITAL_COMMUNITY): Payer: Medicaid Other | Admitting: Physician Assistant

## 2019-07-06 ENCOUNTER — Other Ambulatory Visit: Payer: Self-pay

## 2019-07-06 ENCOUNTER — Encounter (HOSPITAL_COMMUNITY)
Admission: RE | Disposition: A | Discharge status: Home / Self Care | Payer: Self-pay | Source: Home / Self Care | Attending: Vascular Surgery

## 2019-07-06 ENCOUNTER — Ambulatory Visit (HOSPITAL_COMMUNITY)
Admission: RE | Admit: 2019-07-06 | Discharge: 2019-07-06 | Disposition: A | Discharge status: Home / Self Care | Payer: Medicaid Other | Attending: Vascular Surgery | Admitting: Vascular Surgery

## 2019-07-06 ENCOUNTER — Encounter (HOSPITAL_COMMUNITY): Payer: Self-pay

## 2019-07-06 DIAGNOSIS — E876 Hypokalemia: Secondary | ICD-10-CM | POA: Insufficient documentation

## 2019-07-06 DIAGNOSIS — D631 Anemia in chronic kidney disease: Secondary | ICD-10-CM | POA: Diagnosis not present

## 2019-07-06 DIAGNOSIS — Z79899 Other long term (current) drug therapy: Secondary | ICD-10-CM | POA: Insufficient documentation

## 2019-07-06 DIAGNOSIS — Z992 Dependence on renal dialysis: Secondary | ICD-10-CM | POA: Diagnosis not present

## 2019-07-06 DIAGNOSIS — Z841 Family history of disorders of kidney and ureter: Secondary | ICD-10-CM | POA: Diagnosis not present

## 2019-07-06 DIAGNOSIS — N186 End stage renal disease: Secondary | ICD-10-CM | POA: Insufficient documentation

## 2019-07-06 DIAGNOSIS — Q613 Polycystic kidney, unspecified: Secondary | ICD-10-CM | POA: Insufficient documentation

## 2019-07-06 DIAGNOSIS — N185 Chronic kidney disease, stage 5: Secondary | ICD-10-CM

## 2019-07-06 HISTORY — PX: BASCILIC VEIN TRANSPOSITION: SHX5742

## 2019-07-06 LAB — POCT I-STAT, CHEM 8
BUN: 49 mg/dL — ABNORMAL HIGH (ref 6–20)
Calcium, Ion: 0.91 mmol/L — ABNORMAL LOW (ref 1.15–1.40)
Chloride: 105 mmol/L (ref 98–111)
Creatinine, Ser: 7.3 mg/dL — ABNORMAL HIGH (ref 0.61–1.24)
Glucose, Bld: 80 mg/dL (ref 70–99)
HCT: 40 % (ref 39.0–52.0)
Hemoglobin: 13.6 g/dL (ref 13.0–17.0)
Potassium: 3.9 mmol/L (ref 3.5–5.1)
Sodium: 135 mmol/L (ref 135–145)
TCO2: 23 mmol/L (ref 22–32)

## 2019-07-06 SURGERY — TRANSPOSITION, VEIN, BASILIC
Anesthesia: General | Site: Arm Upper | Laterality: Left

## 2019-07-06 MED ORDER — 0.9 % SODIUM CHLORIDE (POUR BTL) OPTIME
TOPICAL | Status: DC | PRN
  Administered 2019-07-06: 1000 mL

## 2019-07-06 MED ORDER — PHENYLEPHRINE 40 MCG/ML (10ML) SYRINGE FOR IV PUSH (FOR BLOOD PRESSURE SUPPORT)
PREFILLED_SYRINGE | INTRAVENOUS | Status: AC
  Filled 2019-07-06: qty 10

## 2019-07-06 MED ORDER — EPHEDRINE 5 MG/ML INJ
INTRAVENOUS | Status: AC
  Filled 2019-07-06: qty 10

## 2019-07-06 MED ORDER — LIDOCAINE 2% (20 MG/ML) 5 ML SYRINGE
INTRAMUSCULAR | Status: AC
  Filled 2019-07-06: qty 5

## 2019-07-06 MED ORDER — CEFAZOLIN SODIUM-DEXTROSE 2-4 GM/100ML-% IV SOLN
INTRAVENOUS | Status: AC
  Filled 2019-07-06: qty 100

## 2019-07-06 MED ORDER — SODIUM CHLORIDE 0.9 % IV SOLN
INTRAVENOUS | Status: DC | PRN
  Administered 2019-07-06: 500 mL

## 2019-07-06 MED ORDER — CEFAZOLIN SODIUM-DEXTROSE 2-4 GM/100ML-% IV SOLN
2.0000 g | INTRAVENOUS | Status: AC
  Administered 2019-07-06: 13:00:00 2 g via INTRAVENOUS

## 2019-07-06 MED ORDER — EPHEDRINE SULFATE-NACL 50-0.9 MG/10ML-% IV SOSY
PREFILLED_SYRINGE | INTRAVENOUS | Status: DC | PRN
  Administered 2019-07-06 (×2): 10 mg via INTRAVENOUS

## 2019-07-06 MED ORDER — MIDAZOLAM HCL 2 MG/2ML IJ SOLN
INTRAMUSCULAR | Status: AC
  Filled 2019-07-06: qty 2

## 2019-07-06 MED ORDER — SODIUM CHLORIDE 0.9 % IV SOLN
INTRAVENOUS | Status: DC | PRN
  Administered 2019-07-06: 20 ug/min via INTRAVENOUS

## 2019-07-06 MED ORDER — FENTANYL CITRATE (PF) 250 MCG/5ML IJ SOLN
INTRAMUSCULAR | Status: AC
  Filled 2019-07-06: qty 5

## 2019-07-06 MED ORDER — LIDOCAINE 2% (20 MG/ML) 5 ML SYRINGE
INTRAMUSCULAR | Status: DC | PRN
  Administered 2019-07-06: 100 mg via INTRAVENOUS

## 2019-07-06 MED ORDER — CHLORHEXIDINE GLUCONATE 4 % EX LIQD: 60.0000 mL | Freq: Once | CUTANEOUS | Status: DC

## 2019-07-06 MED ORDER — ONDANSETRON HCL 4 MG/2ML IJ SOLN: 4.0000 mg | Freq: Once | INTRAMUSCULAR | Status: DC | PRN

## 2019-07-06 MED ORDER — PROPOFOL 10 MG/ML IV BOLUS
INTRAVENOUS | Status: DC | PRN
  Administered 2019-07-06: 140 mg via INTRAVENOUS

## 2019-07-06 MED ORDER — SODIUM CHLORIDE 0.9 % IV SOLN
INTRAVENOUS | Status: AC
  Filled 2019-07-06: qty 1.2

## 2019-07-06 MED ORDER — PHENYLEPHRINE 40 MCG/ML (10ML) SYRINGE FOR IV PUSH (FOR BLOOD PRESSURE SUPPORT)
PREFILLED_SYRINGE | INTRAVENOUS | Status: DC | PRN
  Administered 2019-07-06 (×3): 120 ug via INTRAVENOUS

## 2019-07-06 MED ORDER — FENTANYL CITRATE (PF) 100 MCG/2ML IJ SOLN
INTRAMUSCULAR | Status: DC | PRN
  Administered 2019-07-06 (×3): 25 ug via INTRAVENOUS
  Administered 2019-07-06: 50 ug via INTRAVENOUS
  Administered 2019-07-06 (×3): 25 ug via INTRAVENOUS

## 2019-07-06 MED ORDER — PROTAMINE SULFATE 10 MG/ML IV SOLN
INTRAVENOUS | Status: AC
  Filled 2019-07-06: qty 5

## 2019-07-06 MED ORDER — HYDROMORPHONE HCL 1 MG/ML IJ SOLN: 0.2500 mg | INTRAMUSCULAR | Status: DC | PRN

## 2019-07-06 MED ORDER — DEXAMETHASONE SODIUM PHOSPHATE 10 MG/ML IJ SOLN
INTRAMUSCULAR | Status: DC | PRN
  Administered 2019-07-06: 50 mg via INTRAVENOUS

## 2019-07-06 MED ORDER — ONDANSETRON HCL 4 MG/2ML IJ SOLN
INTRAMUSCULAR | Status: AC
  Filled 2019-07-06: qty 2

## 2019-07-06 MED ORDER — ONDANSETRON HCL 4 MG/2ML IJ SOLN
INTRAMUSCULAR | Status: DC | PRN
  Administered 2019-07-06: 4 mg via INTRAVENOUS

## 2019-07-06 MED ORDER — MEPERIDINE HCL 25 MG/ML IJ SOLN: 6.2500 mg | INTRAMUSCULAR | Status: DC | PRN

## 2019-07-06 MED ORDER — DEXAMETHASONE SODIUM PHOSPHATE 10 MG/ML IJ SOLN
INTRAMUSCULAR | Status: AC
  Filled 2019-07-06: qty 1

## 2019-07-06 MED ORDER — MIDAZOLAM HCL 5 MG/5ML IJ SOLN
INTRAMUSCULAR | Status: DC | PRN
  Administered 2019-07-06: 2 mg via INTRAVENOUS

## 2019-07-06 MED ORDER — PROPOFOL 10 MG/ML IV BOLUS
INTRAVENOUS | Status: AC
  Filled 2019-07-06: qty 20

## 2019-07-06 MED ORDER — LIDOCAINE-EPINEPHRINE 0.5 %-1:200000 IJ SOLN
INTRAMUSCULAR | Status: AC
  Filled 2019-07-06: qty 1

## 2019-07-06 MED ORDER — PHENYLEPHRINE 40 MCG/ML (10ML) SYRINGE FOR IV PUSH (FOR BLOOD PRESSURE SUPPORT): PREFILLED_SYRINGE | INTRAVENOUS | Status: DC | PRN

## 2019-07-06 MED ORDER — SODIUM CHLORIDE 0.9 % IV SOLN
INTRAVENOUS | Status: DC
  Administered 2019-07-06 (×2): via INTRAVENOUS

## 2019-07-06 MED ORDER — STERILE WATER FOR IRRIGATION IR SOLN
Status: DC | PRN
  Administered 2019-07-06: 1000 mL

## 2019-07-06 MED ORDER — OXYCODONE-ACETAMINOPHEN 5-325 MG PO TABS: 1.0000 | ORAL_TABLET | Freq: Four times a day (QID) | ORAL | 0 refills | Status: DC | PRN

## 2019-07-06 SURGICAL SUPPLY — 32 items
ARMBAND PINK RESTRICT EXTREMIT (MISCELLANEOUS) ×3 IMPLANT
CANISTER SUCT 3000ML PPV (MISCELLANEOUS) ×3 IMPLANT
CANNULA VESSEL 3MM 2 BLNT TIP (CANNULA) ×3 IMPLANT
CLIP LIGATING EXTRA MED SLVR (CLIP) ×3 IMPLANT
CLIP LIGATING EXTRA SM BLUE (MISCELLANEOUS) ×3 IMPLANT
COVER PROBE W GEL 5X96 (DRAPES) ×3 IMPLANT
COVER WAND RF STERILE (DRAPES) ×3 IMPLANT
DECANTER SPIKE VIAL GLASS SM (MISCELLANEOUS) ×3 IMPLANT
DERMABOND ADVANCED (GAUZE/BANDAGES/DRESSINGS) ×2
DERMABOND ADVANCED .7 DNX12 (GAUZE/BANDAGES/DRESSINGS) ×1 IMPLANT
ELECT REM PT RETURN 9FT ADLT (ELECTROSURGICAL) ×3
ELECTRODE REM PT RTRN 9FT ADLT (ELECTROSURGICAL) ×1 IMPLANT
GLOVE SS BIOGEL STRL SZ 7.5 (GLOVE) ×1 IMPLANT
GLOVE SUPERSENSE BIOGEL SZ 7.5 (GLOVE) ×2
GOWN STRL REUS W/ TWL LRG LVL3 (GOWN DISPOSABLE) ×3 IMPLANT
GOWN STRL REUS W/TWL LRG LVL3 (GOWN DISPOSABLE) ×6
KIT BASIN OR (CUSTOM PROCEDURE TRAY) ×3 IMPLANT
KIT TURNOVER KIT B (KITS) ×3 IMPLANT
NS IRRIG 1000ML POUR BTL (IV SOLUTION) ×3 IMPLANT
PACK CV ACCESS (CUSTOM PROCEDURE TRAY) ×3 IMPLANT
PAD ARMBOARD 7.5X6 YLW CONV (MISCELLANEOUS) ×6 IMPLANT
SUT PROLENE 5 0 C 1 24 (SUTURE) ×2 IMPLANT
SUT PROLENE 6 0 CC (SUTURE) ×3 IMPLANT
SUT SILK 2 0 SH (SUTURE) IMPLANT
SUT SILK 3 0 (SUTURE) ×4
SUT SILK 3-0 18XBRD TIE 12 (SUTURE) IMPLANT
SUT VIC AB 3-0 SH 27 (SUTURE) ×6
SUT VIC AB 3-0 SH 27X BRD (SUTURE) ×1 IMPLANT
SUT VIC AB 4-0 PS2 18 (SUTURE) ×2 IMPLANT
TOWEL GREEN STERILE (TOWEL DISPOSABLE) ×3 IMPLANT
UNDERPAD 30X30 (UNDERPADS AND DIAPERS) ×3 IMPLANT
WATER STERILE IRR 1000ML POUR (IV SOLUTION) ×3 IMPLANT

## 2019-07-06 NOTE — Discharge Instructions (Signed)
° °  [facility] [place] ° °Discharge Instructions ° °AV Fistula or Graft Surgery for Dialysis Access ° °Please refer to the following instructions for your post-procedure care. Your surgeon or physician assistant will discuss any changes with you. ° °Activity ° °You may drive the day following your surgery, if you are comfortable and no longer taking prescription pain medication. Resume full activity as the soreness in your incision resolves. ° °Bathing/Showering ° °You may shower after you go home. Keep your incision dry for 48 hours. Do not soak in a bathtub, hot tub, or swim until the incision heals completely. You may not shower if you have a hemodialysis catheter. ° °Incision Care ° °Clean your incision with mild soap and water after 48 hours. Pat the area dry with a clean towel. You do not need a bandage unless otherwise instructed. Do not apply any ointments or creams to your incision. You may have skin glue on your incision. Do not peel it off. It will come off on its own in about one week. Your arm may swell a bit after surgery. To reduce swelling use pillows to elevate your arm so it is above your heart. Your doctor will tell you if you need to lightly wrap your arm with an ACE bandage. ° °Diet ° °Resume your normal diet. There are not special food restrictions following this procedure. In order to heal from your surgery, it is CRITICAL to get adequate nutrition. Your body requires vitamins, minerals, and protein. Vegetables are the best source of vitamins and minerals. Vegetables also provide the perfect balance of protein. Processed food has little nutritional value, so try to avoid this. ° °Medications ° °Resume taking all of your medications. If your incision is causing pain, you may take over-the counter pain relievers such as acetaminophen (Tylenol). If you were prescribed a stronger pain medication, please be aware these medications can cause nausea and constipation. Prevent  nausea by taking the medication with a snack or meal. Avoid constipation by drinking plenty of fluids and eating foods with high amount of fiber, such as fruits, vegetables, and grains. Do not take Tylenol if you are taking prescription pain medications. ° ° ° ° °Follow up °Your surgeon may want to see you in the office following your access surgery. If so, this will be arranged at the time of your surgery. ° °Please call us immediately for any of the following conditions: ° °Increased pain, redness, drainage (pus) from your incision site °Fever of 101 degrees or higher °Severe or worsening pain at your incision site °Hand pain or numbness. ° °Reduce your risk of vascular disease: ° °Stop smoking. If you would like help, call [facility] at 1-800-QUIT-NOW (1-800-784-8669) or [facility] at 336-586-4000 ° °Manage your cholesterol °Maintain a desired weight °Control your diabetes °Keep your blood pressure down ° °Dialysis ° °It will take several weeks to several months for your new dialysis access to be ready for use. Your surgeon will determine when it is OK to use it. Your nephrologist will continue to direct your dialysis. You can continue to use your Permcath until your new access is ready for use. ° °If you have any questions, please call the office at [phone]. ° °

## 2019-07-06 NOTE — Interval H&P Note (Signed)
History and Physical Interval Note:  07/06/2019 11:29 AM  Bernard Jordan  has presented today for surgery, with the diagnosis of END STAGE RENAL DISEASE FOR HEMODIALYSIS ACCESS.  The various methods of treatment have been discussed with the patient and family. After consideration of risks, benefits and other options for treatment, the patient has consented to  Procedure(s): SECOND STAGE BASILIC VEIN TRANSPOSITION LEFT ARM (Left) as a surgical intervention.  The patient's history has been reviewed, patient examined, no change in status, stable for surgery.  I have reviewed the patient's chart and labs.  Questions were answered to the patient's satisfaction.     Curt Jews

## 2019-07-06 NOTE — Anesthesia Procedure Notes (Signed)
Procedure Name: LMA Insertion Date/Time: 07/06/2019 12:23 PM Performed by: Shiloh Southern T, CRNA Pre-anesthesia Checklist: Patient identified, Emergency Drugs available, Suction available and Patient being monitored Patient Re-evaluated:Patient Re-evaluated prior to induction Oxygen Delivery Method: Circle system utilized Preoxygenation: Pre-oxygenation with 100% oxygen Induction Type: IV induction Ventilation: Mask ventilation without difficulty LMA: LMA inserted LMA Size: 5.0 Number of attempts: 1 Placement Confirmation: positive ETCO2 and breath sounds checked- equal and bilateral Tube secured with: Tape Dental Injury: Teeth and Oropharynx as per pre-operative assessment

## 2019-07-06 NOTE — Op Note (Signed)
    OPERATIVE REPORT  DATE OF SURGERY: 07/06/2019  PATIENT: GLENDALE OPENSHAW, 53 y.o. male MRN: [id number]  DOB: 05/03/1966  PRE-OPERATIVE DIAGNOSIS: End-stage renal disease  POST-OPERATIVE DIAGNOSIS:  Same  PROCEDURE: Left second stage basilic vein transposition fistula  SURGEON:  Curt Jews, M.D.  PHYSICIAN ASSISTANT: Nurse  ANESTHESIA: General  EBL: per anesthesia record  Total I/O In: 500 [I.V.:500] Out: -   BLOOD ADMINISTERED: none  DRAINS: none  SPECIMEN: none  COUNTS CORRECT:  YES  PATIENT DISPOSITION:  PACU - hemodynamically stable  PROCEDURE DETAILS: Patient was taken operating placed supine position where the area of the left arm prepped draped you sterile fashion SonoSite ultrasound was used to mark the level of the basilic vein from the antecubital space to the axilla.  Incision was made over the prior incision at the antecubital space and carried down to isolate the brachial artery to basilic vein anastomosis.  Several separate incisions were made over the vein in the upper arm and the vein was mobilized circumferentially.  Tributary branches were ligated with 3-0 silk ties and divided.  The vein was occluded near the arteriovenous anastomosis and the vein was transected.  The vein was brought out through the subcutaneous tunnel.  The vein was marked to reduce risk of twisting.  A new tunnel was created in the subcuticular space between the level of the antecubital space and the axilla.  The basilic vein was brought back through this tunnel.  The vein was spatulated and sewn into into the basilic vein near the arterial anastomosis with a running 6-0 Prolene suture.  Clamps removed and excellent thrill was noted.  The wounds were irrigated with saline.  Hemostasis to electrocautery.  Wounds were closed with 3-0 Vicryl in the subcutaneous and subcuticular tissue.  Sterile dressing was applied and the patient was transferred to the recovery in stable condition   Rosetta Posner, M.D., [facility] 07/06/2019 2:53 PM

## 2019-07-06 NOTE — Transfer of Care (Signed)
Immediate Anesthesia Transfer of Care Note  Patient: KIEGAN MANGINO  Procedure(s) Performed: SECOND STAGE BASILIC VEIN TRANSPOSITION LEFT ARM (Left Arm Upper)  Patient Location: PACU  Anesthesia Type:General  Level of Consciousness: drowsy  Airway & Oxygen Therapy: Patient Spontanous Breathing and Patient connected to nasal cannula oxygen  Post-op Assessment: Report given to RN, Post -op Vital signs reviewed and stable and Patient moving all extremities  Post vital signs: Reviewed and stable  Last Vitals:  Vitals Value Taken Time  BP 94/61 07/06/19 1507  Temp    Pulse 95 07/06/19 1507  Resp 19 07/06/19 1507  SpO2 100 % 07/06/19 1507  Vitals shown include unvalidated device data.  Last Pain:  Vitals:   07/06/19 0824  PainSc: 0-No pain      Patients Stated Pain Goal: 3 ([id number])  Complications: No apparent anesthesia complications

## 2019-07-06 NOTE — Anesthesia Postprocedure Evaluation (Signed)
Anesthesia Post Note  Patient: Bernard Jordan  Procedure(s) Performed: SECOND STAGE BASILIC VEIN TRANSPOSITION LEFT ARM (Left Arm Upper)     Patient location during evaluation: PACU Anesthesia Type: General Level of consciousness: awake and alert Pain management: pain level controlled Vital Signs Assessment: post-procedure vital signs reviewed and stable Respiratory status: spontaneous breathing, nonlabored ventilation, respiratory function stable and patient connected to nasal cannula oxygen Cardiovascular status: blood pressure returned to baseline and stable Postop Assessment: no apparent nausea or vomiting Anesthetic complications: no    Last Vitals:  Vitals:   07/06/19 1521 07/06/19 1535  BP: (!) 90/59 90/61  Pulse: 96 97  Resp: 19 (!) 24  Temp:  36.4 C  SpO2: 94% 95%    Last Pain:  Vitals:   07/06/19 1535  PainSc: 0-No pain                 Purl Claytor DAVID

## 2019-07-06 NOTE — Addendum Note (Signed)
Addendum  created 07/06/19 1847 by Ollen Bowl, CRNA   Intraprocedure Event edited

## 2019-07-06 NOTE — Anesthesia Preprocedure Evaluation (Signed)
Anesthesia Evaluation  Patient identified by MRN, date of birth, ID band Patient awake    Reviewed: Allergy & Precautions, NPO status , Patient's Chart, lab work & pertinent test results  Airway Mallampati: I  TM Distance: >3 FB Neck ROM: Full    Dental   Pulmonary    Pulmonary exam normal        Cardiovascular Normal cardiovascular exam     Neuro/Psych    GI/Hepatic   Endo/Other    Renal/GU Dialysis and ESRFRenal disease     Musculoskeletal   Abdominal   Peds  Hematology   Anesthesia Other Findings   Reproductive/Obstetrics                             Anesthesia Physical Anesthesia Plan  ASA: III  Anesthesia Plan: General   Post-op Pain Management:    Induction: Intravenous  PONV Risk Score and Plan: 2 and Ondansetron and Midazolam  Airway Management Planned: LMA  Additional Equipment:   Intra-op Plan:   Post-operative Plan: Extubation in OR  Informed Consent: I have reviewed the patients History and Physical, chart, labs and discussed the procedure including the risks, benefits and alternatives for the proposed anesthesia with the patient or authorized representative who has indicated his/her understanding and acceptance.       Plan Discussed with: CRNA and Surgeon  Anesthesia Plan Comments:         Anesthesia Quick Evaluation

## 2019-07-07 ENCOUNTER — Encounter (HOSPITAL_COMMUNITY): Payer: Self-pay | Admitting: Vascular Surgery

## 2019-08-04 ENCOUNTER — Other Ambulatory Visit: Payer: Self-pay

## 2019-08-04 ENCOUNTER — Ambulatory Visit (INDEPENDENT_AMBULATORY_CARE_PROVIDER_SITE_OTHER): Payer: Self-pay | Admitting: Physician Assistant

## 2019-08-04 VITALS — BP 124/68 | HR 60 | Temp 98.0°F | Resp 12 | Ht 71.0 in | Wt 212.0 lb

## 2019-08-04 DIAGNOSIS — N186 End stage renal disease: Secondary | ICD-10-CM

## 2019-08-04 NOTE — Progress Notes (Signed)
    Postoperative Access Visit   History of Present Illness   Bernard Jordan is a 53 y.o. year old male who presents for postoperative follow-up for: left second stage basilic vein transposition by Dr. Donnetta Hutching (Date: 07/06/19).  The patient's wounds are healed.  The patient denies steal symptoms.  The patient is able to complete their activities of daily living.  He is dialyzing on TTS schedule via [facility] TDC at [facility].  Physical Examination   Vitals:   08/04/19 1240  BP: 124/68  Pulse: 60  Resp: 12  Temp: 98 F (36.7 C)  TempSrc: Temporal  SpO2: 97%  Weight: 212 lb (96.2 kg)  Height: 5\' 11"  (1.803 m)   Body mass index is 29.57 kg/m.  left arm Incisions are well healed, hand grip is 5/5, sensation in digits is intact, palpable thrill, bruit can be auscultated, palpable L radial pulse     Medical Decision Making   Bernard Jordan is a 54 y.o. year old male who presents s/p left second stage basilic vein transposition   Patent brachiobasilic fistula without signs or symptoms of steal syndrome  The patient's access is ready for use at his next HD treatment  The patient's tunneled dialysis catheter can be removed when Nephrology is comfortable with the performance of the fistula  The patient may follow up on a prn basis   Dagoberto Ligas PA-C [facility] [place] Office: [phone]  Clinic MD: Dr. Donnetta Hutching

## 2019-08-12 ENCOUNTER — Ambulatory Visit: Payer: Self-pay | Admitting: Family Medicine

## 2019-09-04 ENCOUNTER — Other Ambulatory Visit: Payer: Self-pay

## 2019-09-04 ENCOUNTER — Ambulatory Visit: Payer: Self-pay | Attending: Family Medicine | Admitting: Family Medicine

## 2019-12-07 NOTE — Progress Notes (Signed)
HISTORY AND PHYSICAL     CC:  dialysis access Requesting Provider:  Antony Blackbird, MD  HPI: This is a 54 y.o. male here for evaluation of his hemodialysis access.  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 Donnetta Hutching on 07/06/2019.  He was seen back on 08/04/2019 and he was doing well.    The pt is referred today for swelling around fistula after a stick proximally.  He states that this happened about a month ago and has resolved.  He states his fistula is working well without any difficulty.  He states they call him "juicy" because it works so well.  He states he recently had them start rotating his sticks.  He does not have any pain in his hand.    The pt is right hand dominant.     If pt on Dialysis:   Dialysis days/center:  T/T/S at [facility] location  The pt is not on a statin for cholesterol management.  The pt is not on a daily aspirin.  Other AC:  none The pt is not on medication for hypertension.  The pt is not diabetic.   Tobacco hx:  never  Past Medical History:  Diagnosis Date  . AKI (acute kidney injury) ([place]) 05/08/2019  . CKD (chronic kidney disease), stage IV ([place]) 05/08/2019  . Polycystic kidney disease     Past Surgical History:  Procedure Laterality Date  . BASCILIC VEIN TRANSPOSITION Left 05/14/2019   Procedure: BASILIC VEIN TRANSPOSITION;  Surgeon: Rosetta Posner, MD;  Location: [facility];  Service: Vascular;  Laterality: Left;  . BASCILIC VEIN TRANSPOSITION Left 07/06/2019   Procedure: SECOND STAGE BASILIC VEIN TRANSPOSITION LEFT ARM;  Surgeon: Rosetta Posner, MD;  Location: [facility];  Service: Vascular;  Laterality: Left;  . INSERTION OF DIALYSIS CATHETER Right 05/14/2019   Procedure: INSERTION OF TUNNELED DIALYSIS CATHETER;  Surgeon: Rosetta Posner, MD;  Location: [facility];  Service: Vascular;  Laterality: Right;  . shoulder surgery rotator cuff Left     No Known Allergies  No current outpatient medications on file.   No current facility-administered medications for this  visit.    Family History  Problem Relation Age of Onset  . Renal Disease Mother        gets iHD    Social History   Socioeconomic History  . Marital status: Married    Spouse name: Not on file  . Number of children: Not on file  . Years of education: Not on file  . Highest education level: Not on file  Occupational History  . Not on file  Tobacco Use  . Smoking status: Never Smoker  . Smokeless tobacco: Never Used  Substance and Sexual Activity  . Alcohol use: Not Currently  . Drug use: Never  . Sexual activity: Not Currently  Other Topics Concern  . Not on file  Social History Narrative  . Not on file   Social Determinants of Health   Financial Resource Strain:   . Difficulty of Paying Living Expenses: Not on file  Food Insecurity:   . Worried About Charity fundraiser in the Last Year: Not on file  . Ran Out of Food in the Last Year: Not on file  Transportation Needs:   . Lack of Transportation (Medical): Not on file  . Lack of Transportation (Non-Medical): Not on file  Physical Activity:   . Days of Exercise per Week: Not on file  . Minutes of Exercise per Session:  Not on file  Stress:   . Feeling of Stress : Not on file  Social Connections:   . Frequency of Communication with Friends and Family: Not on file  . Frequency of Social Gatherings with Friends and Family: Not on file  . Attends Religious Services: Not on file  . Active Member of Clubs or Organizations: Not on file  . Attends Archivist Meetings: Not on file  . Marital Status: Not on file  Intimate Partner Violence:   . Fear of Current or Ex-Partner: Not on file  . Emotionally Abused: Not on file  . Physically Abused: Not on file  . Sexually Abused: Not on file     ROS: [x]  Positive   [ ]  Negative   [ ]  All sytems reviewed and are negative  Cardiac: []  chest pain/pressure []  SOB []  DOE  Vascular: []  pain in legs while walking []  pain in feet when lying flat []  hx of DVT []   swelling in legs  Pulmonary: []  asthma []  wheezing  Neurologic: []  weakness in []  arms []  legs []  numbness in []  arms []  legs [] difficulty speaking or slurred speech []  temporary loss of vision in one eye []  dizziness  Hematologic: []  bleeding problems  GI []  GERD  GU: [x]  CKD/renal failure  [x]  HD---[]  M/W/F [x]  T/T/S  Psychiatric: []  hx of major depression  Integumentary: []  rashes []  ulcers  Constitutional: []  fever []  chills  PHYSICAL EXAMINATION:  Today's Vitals   12/09/19 0914 12/09/19 0923  BP: (!) 85/55   Pulse: 99   Resp: 16   Temp: 99.2 F (37.3 C)   TempSrc: Oral   SpO2: (!) 82% 98%  Weight: 215 lb (97.5 kg)   Height: 5\' 11"  (1.803 m)    Body mass index is 29.99 kg/m.  General:  WDWN male in NAD Gait: Normal HENT: WNL Pulmonary: normal non-labored breathing , without Rales, rhonchi,  wheezing Cardiac: regular, without  Murmurs without carotid bruits Abdomen: soft, NT, no masses Skin: without rashes, without ulcers  Vascular Exam/Pulses:   Right Left  Radial 2+ (normal) Unable to palpate   Ulnar Unable to palpate  Unable to palpate    Extremities:  The fistula is patent.  There is a good thrill distally and proximally and pulsatile in between.  Motor and sensation are in tact.  Right hand grip 5/5.  No evidence of hematoma. Musculoskeletal: no muscle wasting or atrophy  Neurologic: A&O X 3; Moving all extremities equally;  Speech is fluent/normal  Non-Invasive Vascular Imaging:   None today  ASSESSMENT/PLAN: 54 y.o. male with ESRD here for evaluation of his  hemodialysis access  -pt doing well.  Sounds as if fistula was infiltrated about a month ago and this has resolved.  He does not have evidence of steal.   -his fistula is working well without difficulty.  Discussed with Dr. Oneida Alar and fistula is pulsatile but if working well, would continue to use.  If they start having difficulty with the fistula, can see him back and plan a  fistulogram.  -pt hypotensive today but pt states this is his normal.  Oxygen saturations were down, but when asked to take some deep breaths, rose up 98% on RA.   Leontine Locket, PA-C [facility] [phone]  Clinic MD:  Oneida Alar

## 2019-12-09 ENCOUNTER — Ambulatory Visit (INDEPENDENT_AMBULATORY_CARE_PROVIDER_SITE_OTHER): Payer: Self-pay | Admitting: Physician Assistant

## 2019-12-09 ENCOUNTER — Other Ambulatory Visit: Payer: Self-pay

## 2019-12-09 VITALS — BP 85/55 | HR 99 | Temp 99.2°F | Resp 16 | Ht 71.0 in | Wt 215.0 lb

## 2019-12-09 DIAGNOSIS — N186 End stage renal disease: Secondary | ICD-10-CM

## 2021-02-13 ENCOUNTER — Telehealth: Payer: Self-pay

## 2021-02-13 NOTE — Telephone Encounter (Signed)
NOTES ON FILE FROM [facility] [phone] SENT REFERRAL TO [place]

## 2021-04-08 IMAGING — US US RENAL
2 series · 14 of 25 positions shown · non-contrast
Comparison: November 19, 2016

CLINICAL DATA: Chronic renal disease, stage IV.

EXAM:
RENAL / URINARY TRACT ULTRASOUND COMPLETE

[Series 1: us renal · 13 of 75 slices shown (1 of 2)]
[im 1/75]
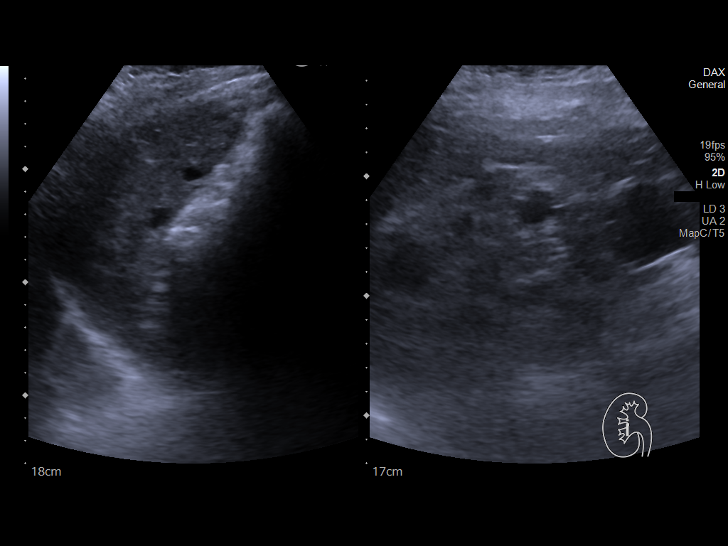
[im 7/75]
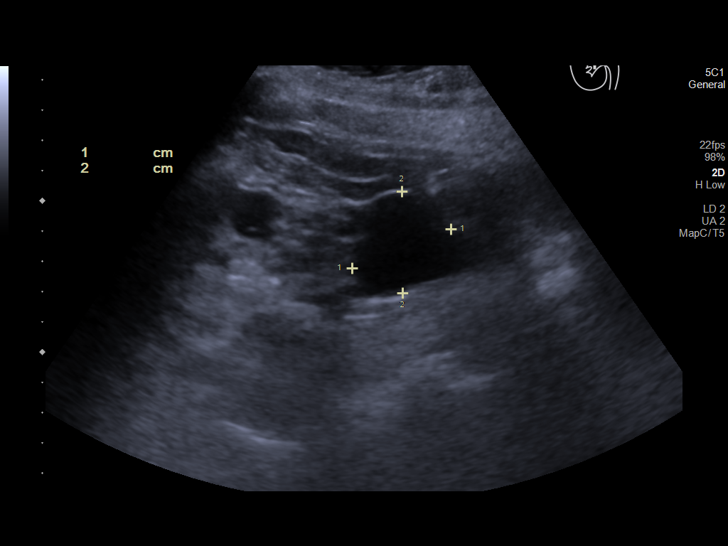
[im 13/75]
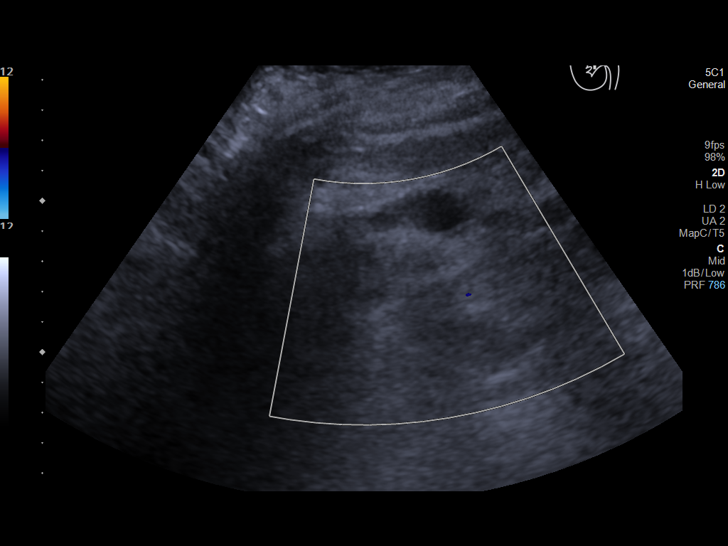
[im 20/75]
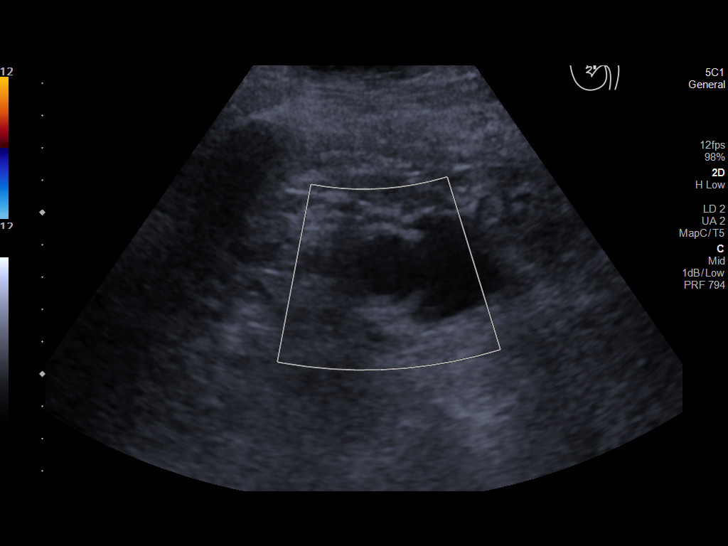
[im 26/75]
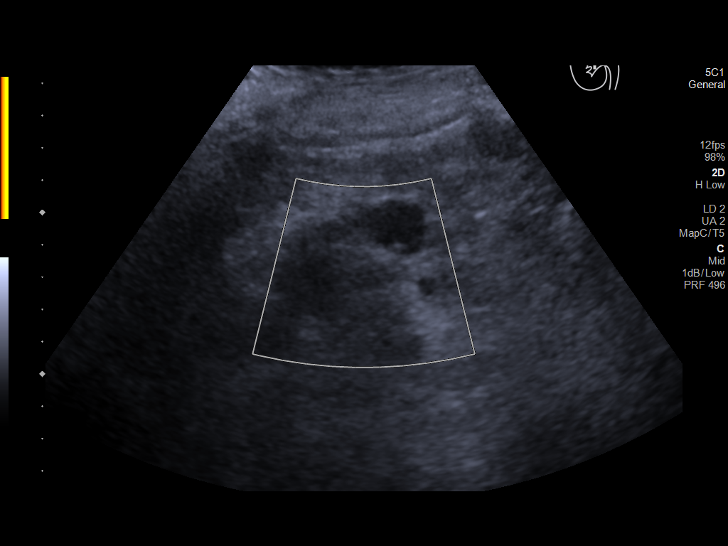
[im 29/75]
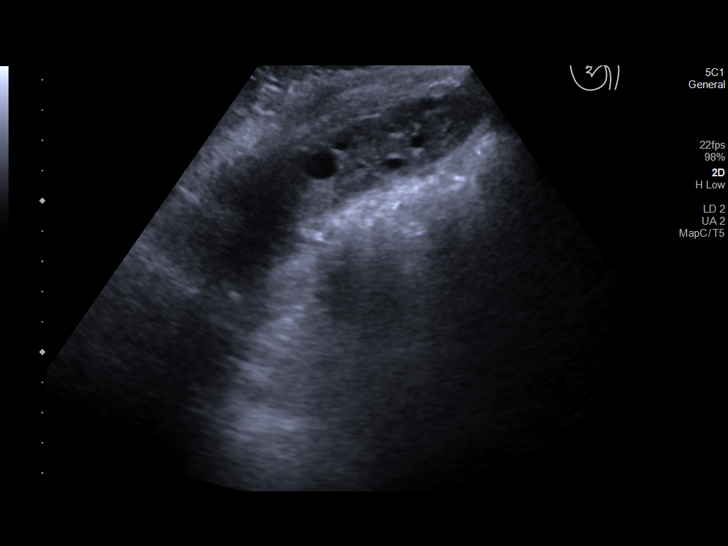
[im 36/75]
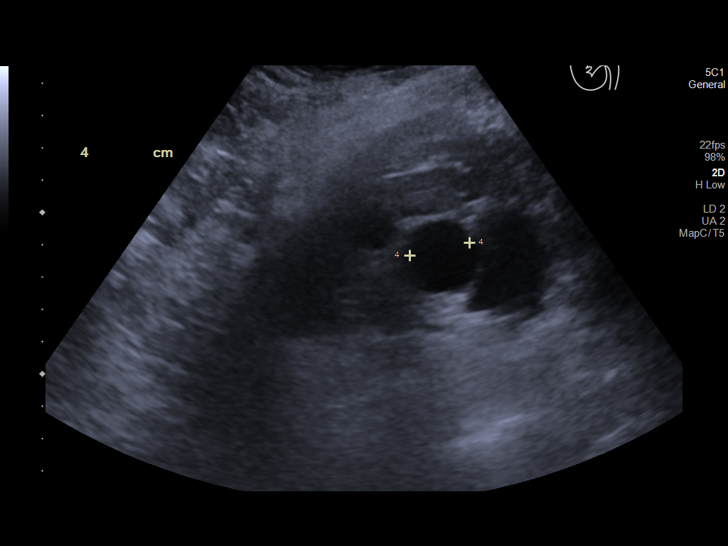
[im 42/75]
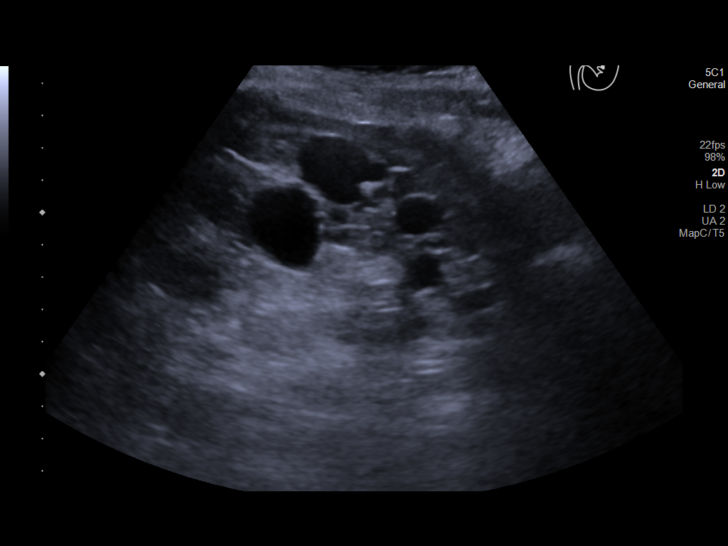
[im 49/75]
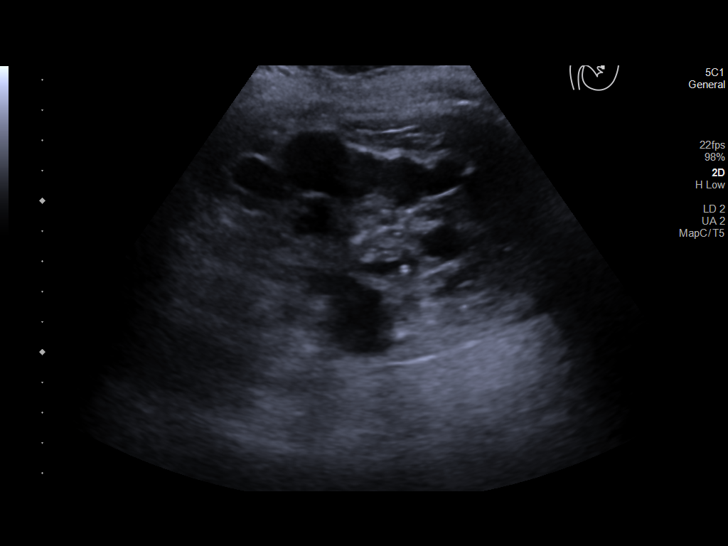
[im 52/75]
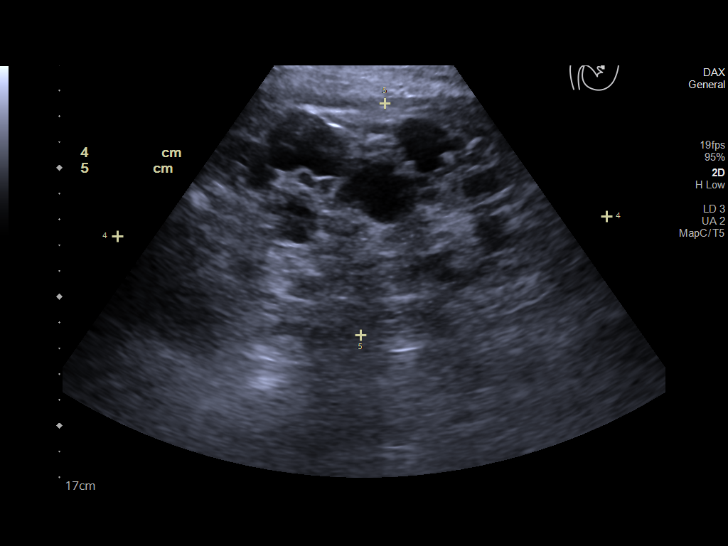
[im 58/75]
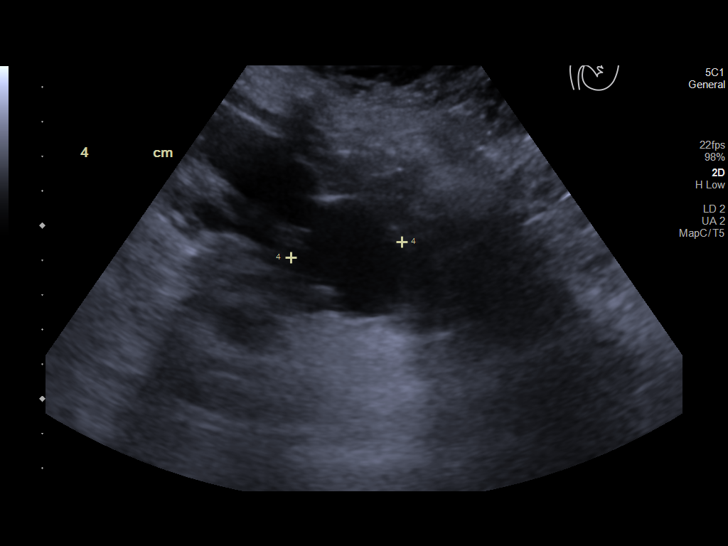
[im 65/75]
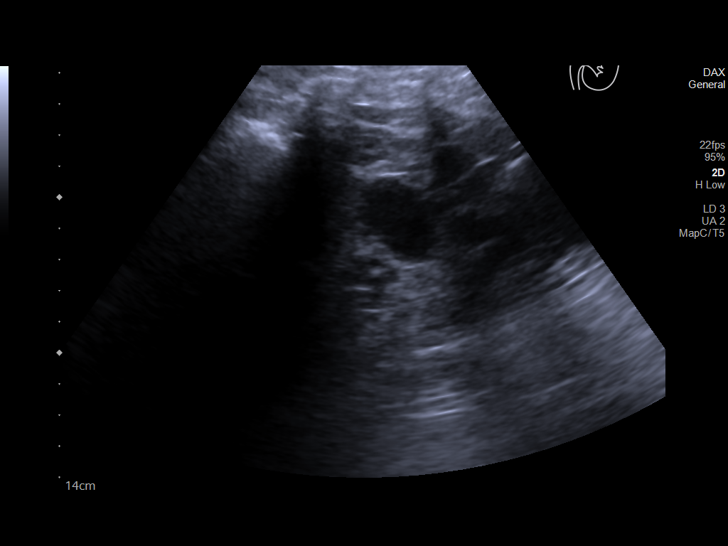
[im 71/75]
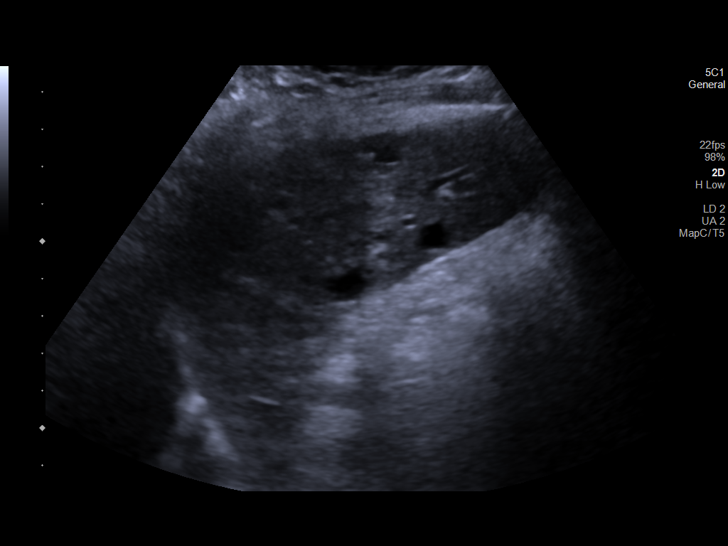

[Series 2: us renal · 1 of 4 slices shown (2 of 2)]
[im 1/4]
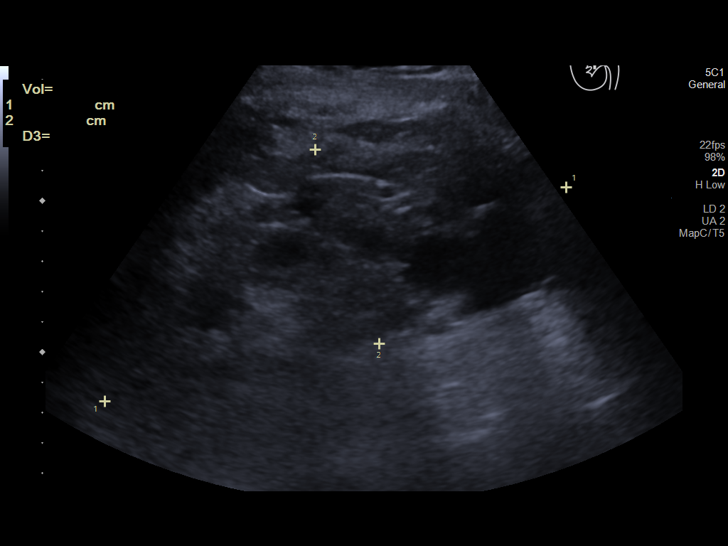

[14 of 25 positions shown; findings below may reference images not displayed]

FINDINGS: Right Kidney:

Renal measurements: 16.8 x 6.8 x 6.8 = volume: 401 mL. Increased
parenchymal echogenicity with prominent cortical thinning. More than
10 renal cysts are seen with benign appearance, measuring up to
cm.

Left Kidney:

Renal measurements: 19.0 by 8.7 x 8.5 cm = volume: 735 mL. Increased
parenchymal echogenicity and prominent cortical thinning. More than
10 benign-appearing cysts are seen, the largest measuring 3.9 cm.

Bladder:

The bladder is decompressed around urinary Foley and therefore not
well visualized.

Incidental note of benign-appearing liver cysts.
IMPRESSION: Bilateral increased renal parenchymal echogenicity with prominent
cortical thinning.

More than 10 in each kidney benign-appearing cysts.

## 2021-04-08 IMAGING — DX DG ABDOMEN ACUTE W/ 1V CHEST
4 series · 4 of 4 positions shown · non-contrast
Comparison: No recent prior.

CLINICAL DATA: Abdominal pain.  Intermittent nausea and vomiting.

EXAM:
DG ABDOMEN ACUTE W/ 1V CHEST

[abdomen supine (1 of 2)]
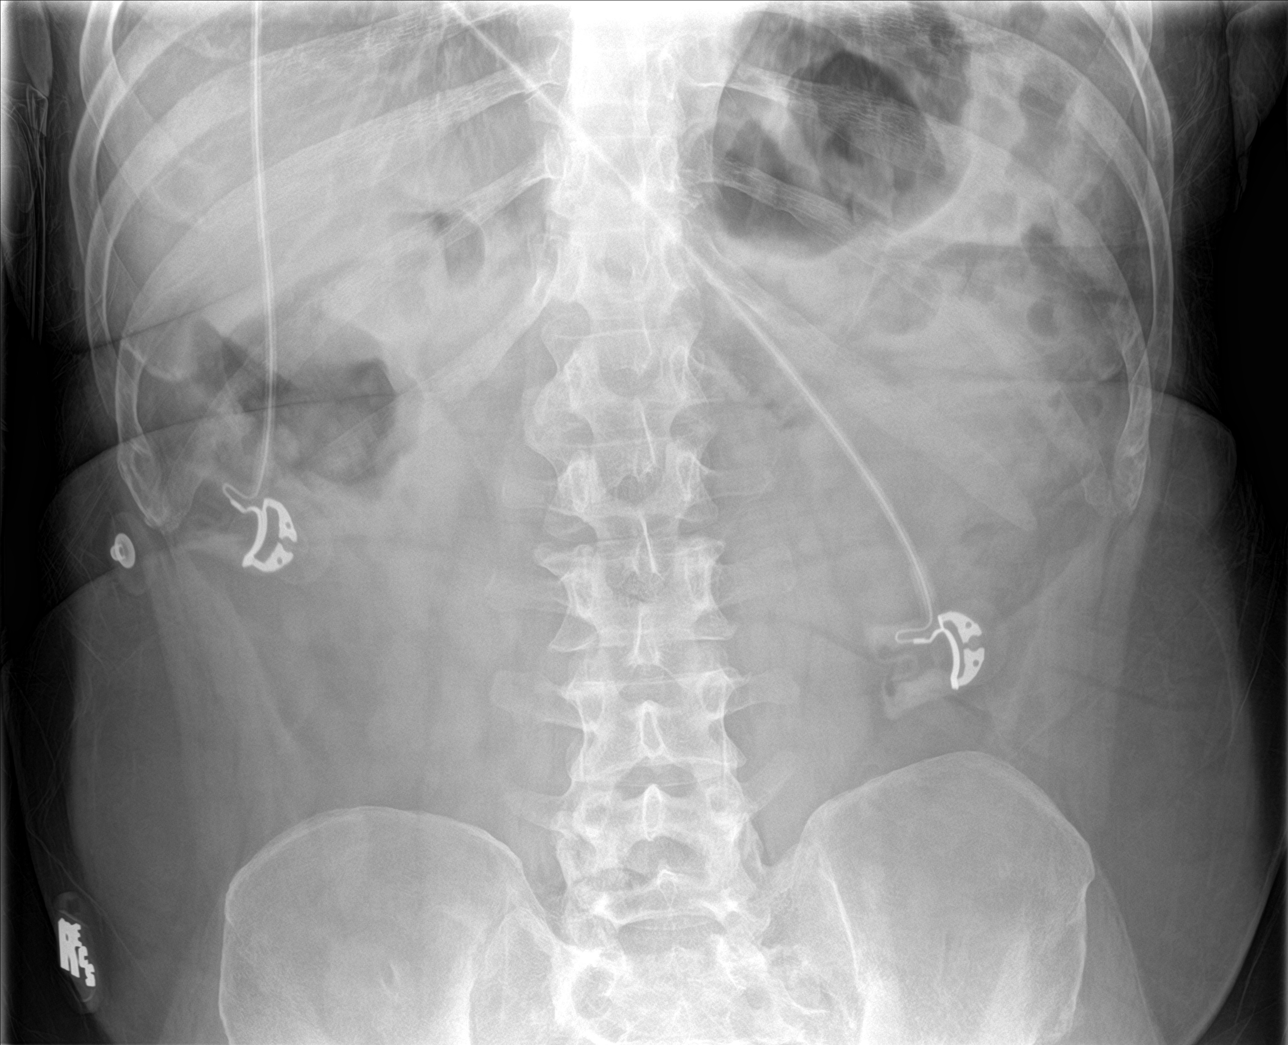

[abdomen supine (2 of 2)]
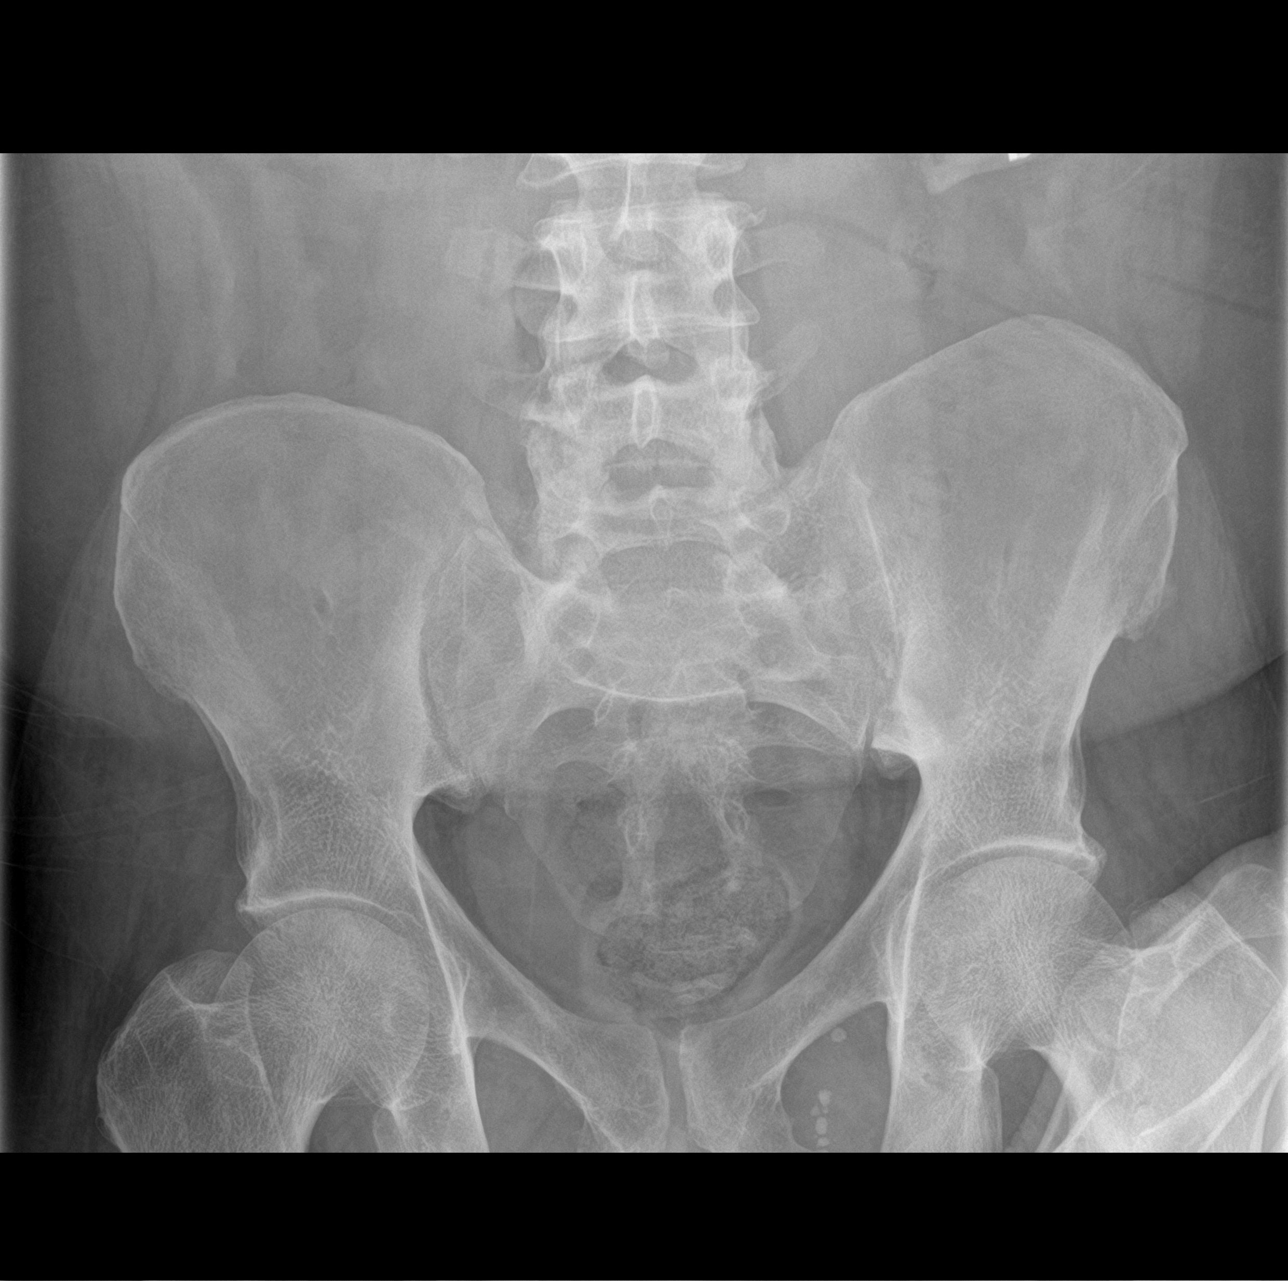

[abdomen decu]
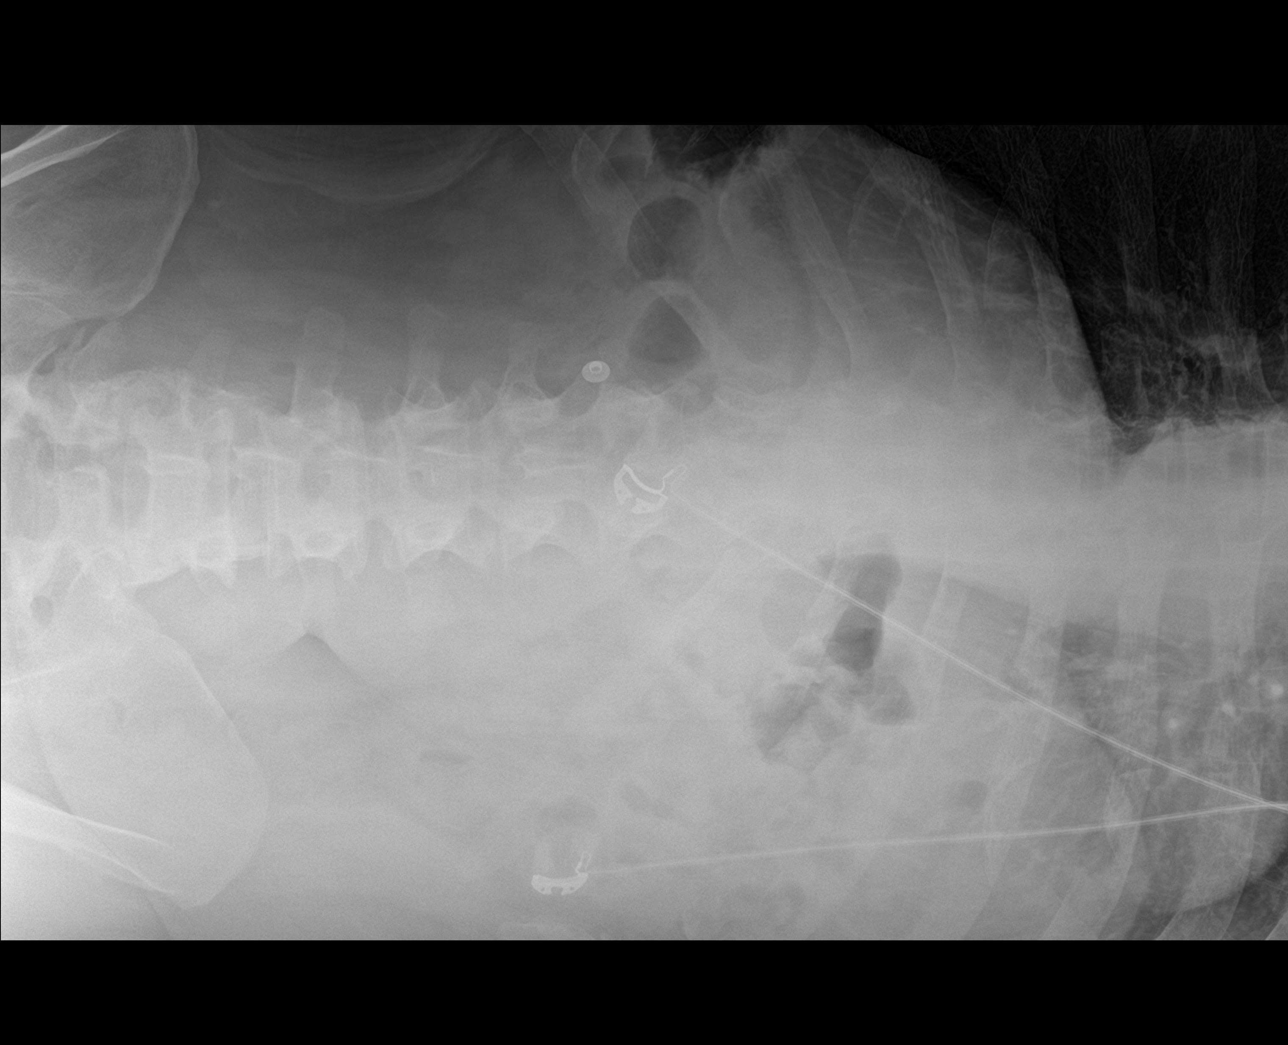

[chest ap]
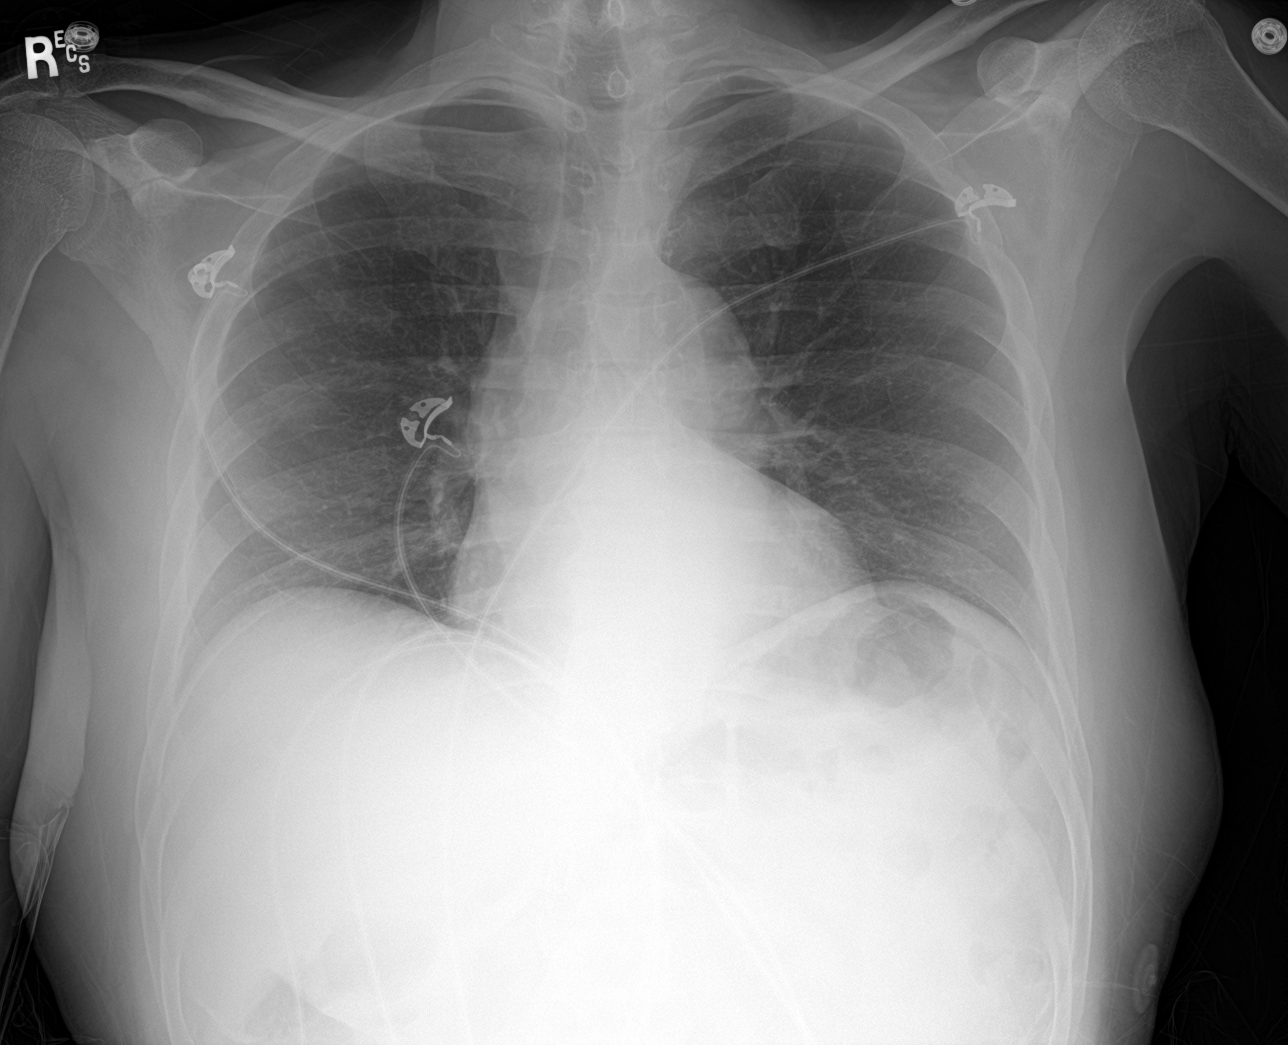

[4 of 4 positions shown; findings below may reference images not displayed]

FINDINGS: No acute cardiopulmonary disease. No bowel distention or free air
thoracolumbar spine scoliosis. No acute bony abnormality. Pelvic
calcifications consistent phleboliths.
IMPRESSION: 1.  No acute cardiopulmonary disease.

2.  No acute intra-abdominal abnormality.

## 2021-05-14 NOTE — Progress Notes (Signed)
Cardiology Office Note:    Date:  05/15/2021   ID:  Bernard Jordan, DOB 08-12-1966, MRN [id number]  PCP:  Antony Blackbird, MD (Inactive)   [facility] Providers Cardiologist:  Werner Lean, MD     Referring MD: Madelon Lips, MD   CC: Feels fine Consulted for the evaluation of abnormal stress test at the behest of Antony Blackbird, MD (Inactive)  History of Present Illness:    Bernard Jordan is a 55 y.o. male with a hx of HTN, ESRD and a recent stress test who presents for evaluation 05/15/21.  Patient notes that he is feeling great.  Has had no chest pain, chest pressure, chest tightness, chest stinging .  Had no symptoms during 01/24/21 stress test. Patient exertion notable for walking, doing yardwork, and walks his dogs.  May have had DOE in April (with lack of activity)  Since he started exercising again and going to HD has had no issues. No symptoms with activity.  No shortness of breath, DOE .  No PND or orthopnea.  No weight gain, leg swelling , or abdominal swelling.  No syncope or near syncope . Notes  no palpitations or funny heart beats.   No leg claudication.  Patient had an echocardiogram and stress echo at [facility] with low normal EF and no WMAs.  Is hoping to get a kidney transplant.  Past Medical History:  Diagnosis Date   AKI (acute kidney injury) ([place]) 05/08/2019   CKD (chronic kidney disease), stage IV ([place]) 05/08/2019   Polycystic kidney disease     Past Surgical History:  Procedure Laterality Date   BASCILIC VEIN TRANSPOSITION Left 05/14/2019   Procedure: BASILIC VEIN TRANSPOSITION;  Surgeon: Rosetta Posner, MD;  Location: [place];  Service: Vascular;  Laterality: Left;   [place] Left 07/06/2019   Procedure: SECOND STAGE BASILIC VEIN TRANSPOSITION LEFT ARM;  Surgeon: Rosetta Posner, MD;  Location: [facility];  Service: Vascular;  Laterality: Left;   INSERTION OF DIALYSIS CATHETER Right 05/14/2019   Procedure: INSERTION OF TUNNELED DIALYSIS CATHETER;   Surgeon: Rosetta Posner, MD;  [facility];  Service: Vascular;  Laterality: Right;   shoulder surgery rotator cuff Left     Current Medications: Current Meds  Medication Sig   acetaminophen (TYLENOL) 500 MG tablet Take by mouth.   b complex-vitamin c-folic acid (NEPHRO-VITE) 0.8 MG TABS tablet Take by mouth.   brimonidine (ALPHAGAN) 0.2 % ophthalmic solution 1 drop daily.   calcitRIOL (ROCALTROL) 0.5 MCG capsule Take by mouth daily.   Cyanocobalamin (B-12 DOTS) 500 MCG TBDP Take by mouth daily.   dorzolamide-timolol (COSOPT) 22.3-6.8 MG/ML ophthalmic solution Place 1 drop into the right eye 3 (three) times daily.   Doxercalciferol (HECTOROL IV) as directed.   ferric citrate (AURYXIA) 1 GM 210 MG(Fe) tablet Take by mouth.   K Phos Mono-Sod Phos Di & Mono (PHOSPHA 250 NEUTRAL) [phone] MG TABS Take by mouth.   lidocaine-prilocaine (EMLA) cream Apply 1 a small amount to skin as directed as directed Apply to access 1/2hr-1hr prior to treatment, cover with saran wrap   midodrine (PROAMATINE) 10 MG tablet Take 1 tablet by mouth as directed as directed 3x weekly on HD days and one dose 2 hrs into treatment   multivitamin (RENA-VIT) TABS tablet Take by mouth.   ofloxacin (OCUFLOX) 0.3 % ophthalmic solution Place 1 drop into the right eye 4 (four) times daily.   Olopatadine HCl 0.2 % SOLN daily.   prednisoLONE acetate (  PRED FORTE) 1 % ophthalmic suspension Place 1 drop into the right eye 4 (four) times daily.     Allergies:   Patient has no known allergies.   Social History   Socioeconomic History   Marital status: Married    Spouse name: Not on file   Number of children: Not on file   Years of education: Not on file   Highest education level: Not on file  Occupational History   Not on file  Tobacco Use   Smoking status: Never   Smokeless tobacco: Never  Vaping Use   Vaping Use: Never used  Substance and Sexual Activity   Alcohol use: Not Currently   Drug use: Never   Sexual  activity: Not Currently  Other Topics Concern   Not on file  Social History Narrative   Not on file   Social Determinants of Health   Financial Resource Strain: Not on file  Food Insecurity: Not on file  Transportation Needs: Not on file  Physical Activity: Not on file  Stress: Not on file  Social Connections: Not on file    Family History: The patient's family history includes Renal Disease in his mother. History of coronary artery disease notable for no members. History of heart failure notable for CHF in mother and may have had ICD.  ROS:   Please see the history of present illness.     All other systems reviewed and are negative.  EKGs/Labs/Other Studies Reviewed:    The following studies were reviewed today:  EKG:  EKG is  ordered today.  The ekg ordered today demonstrates  05/15/21: SR 87 WNL  Transthoracic Echocardiogram: Date: 01/04/21 Results: The left ventricular size is normal.  Mild left ventricular hypertrophy  Left ventricular systolic function is low normal.  LV ejection fraction = 50-55%.  Left ventricular filling pattern is prolonged relaxation.  The right ventricle is normal in size and function.  There is no significant valvular stenosis or regurgitation.  The IVC is normal in size with an inspiratory collapse of greater then  50%, suggesting normal right atrial pressure.  There is no pericardial effusion.  Probably no significant change in comparison with the prior study  noted    Stress Echo : Date: 01/04/21 Results: Report only STRESS ECHO  Normal left ventricular function and global wall motion with stress.  There was normal increase in global LV function post exercise. The  estimated LV ejection fraction is >70% with stress. Negative exercise  echocardiography for inducible ischemia at target heart rate. The  sensitivity of the stress test might decrease due to poor exercise  capacity .     Recent Labs: No results found for requested  labs within last 8760 hours.  Recent Lipid Panel No results found for: CHOL, TRIG, HDL, CHOLHDL, VLDL, LDLCALC, LDLDIRECT   Physical Exam:    VS:  BP 96/62   Pulse 87   Ht '5\' 11"'$  (1.803 m)   Wt 222 lb (100.7 kg)   SpO2 97%   BMI 30.96 kg/m     Wt Readings from Last 3 Encounters:  05/15/21 222 lb (100.7 kg)  12/09/19 215 lb (97.5 kg)  08/04/19 212 lb (96.2 kg)     GEN:  Well nourished, well developed in no acute distress HEENT: Normal NECK: No JVD LYMPHATICS: No lymphadenopathy CARDIAC: RRR, no murmurs, rubs, gallops (Left arm Bruit strong bruit and thrill) RESPIRATORY:  Clear to auscultation without rales, wheezing or rhonchi  ABDOMEN: Soft, non-tender, non-distended MUSCULOSKELETAL:  No edema; No deformity  SKIN: Warm and dry NEUROLOGIC:  Alert and oriented x 3 PSYCHIATRIC:  Normal affect   ASSESSMENT:    1. Abnormal cardiovascular stress test   2. ESRD (end stage renal disease) ([place])   3. Essential hypertension    PLAN:    ESRD seeing [facility] and getting kidney transplant eval at [facility] Abnormal NM Stress test - abnormality is low normal EF - will get exercise NM Stress for clarification on cardiac risk prior to kidney transplant - midodrine per nephrology  Six months follow up unless new symptoms or abnormal test results warranting change in plan  Would be reasonable for  APP Follow up    Shared Decision Making/Informed Consent The risks [chest pain, shortness of breath, cardiac arrhythmias, dizziness, blood pressure fluctuations, myocardial infarction, stroke/transient ischemic attack, nausea, vomiting, allergic reaction, radiation exposure, metallic taste sensation and life-threatening complications (estimated to be 1 in 10,000)], benefits (risk stratification, diagnosing coronary artery disease, treatment guidance) and alternatives of a nuclear stress test were discussed in detail with Bernard Jordan and he agrees to proceed.    Medication  Adjustments/Labs and Tests Ordered: Current medicines are reviewed at length with the patient today.  Concerns regarding medicines are outlined above.  Orders Placed This Encounter  Procedures   MYOCARDIAL PERFUSION IMAGING   EKG 12-Lead    No orders of the defined types were placed in this encounter.   Patient Instructions  Medication Instructions:  Your physician recommends that you continue on your current medications as directed. Please refer to the Current Medication list given to you today.  *If you need a refill on your cardiac medications before your next appointment, please call your pharmacy*   Lab Work: NONE If you have labs (blood work) drawn today and your tests are completely normal, you will receive your results only by: [place] (if you have MyChart) OR A paper copy in the mail If you have any lab test that is abnormal or we need to change your treatment, we will call you to review the results.   Testing/Procedures: Your physician has requested that you have en exercise stress myoview. For further information please visit [email or web address]. Please follow instruction sheet, as given.    Follow-Up: At [facility], you and your health needs are our priority.  As part of our continuing mission to provide you with exceptional heart care, we have created designated Provider Care Teams.  These Care Teams include your primary Cardiologist (physician) and Advanced Practice Providers (APPs -  Physician Assistants and Nurse Practitioners) who all work together to provide you with the care you need, when you need it.  We recommend signing up for the patient portal called "MyChart".  Sign up information is provided on this After Visit Summary.  MyChart is used to connect with patients for Virtual Visits (Telemedicine).  Patients are able to view lab/test results, encounter notes, upcoming appointments, etc.  Non-urgent messages can be sent to your provider as well.    To learn more about what you can do with MyChart, go to [email or web address].    Your next appointment:   6 month(s)  The format for your next appointment:   In Person  Provider:   You may see Werner Lean, MD or one of the following Advanced Practice Providers on your designated Care Team:   Melina Copa, PA-C Ermalinda Barrios, PA-C   Other Instructions  You are scheduled for a Myocardial Perfusion Imaging Study. Please arrive 15 minutes prior  to your appointment time for registration and insurance purposes.   The test will take approximately 3 to 4 hours to complete; you may bring reading material.  If someone comes with you to your appointment, they will need to remain in the main lobby due to limited space in the testing area.    How to prepare for your Myocardial Perfusion Test: Do not eat or drink 3 hours prior to your test, except you may have water. Do not consume products containing caffeine (regular or decaffeinated) 12 hours prior to your test. (ex: coffee, chocolate, sodas, tea). Do bring a list of your current medications with you.  If not listed below, you may take your medications as normal. Do wear comfortable clothes (no dresses or overalls) and walking shoes, tennis shoes preferred (No heels or open toe shoes are allowed). Do NOT wear cologne, perfume, aftershave, or lotions (deodorant is allowed). If these instructions are not followed, your test will have to be rescheduled.  If you cannot keep your appointment, please provide 24 hours notification to the Nuclear Lab, to avoid a possible $50 charge to your account.        Signed, Werner Lean, MD  05/15/2021 10:11 AM    [facility]

## 2021-05-15 ENCOUNTER — Other Ambulatory Visit: Payer: Self-pay

## 2021-05-15 ENCOUNTER — Encounter: Payer: Self-pay | Admitting: Internal Medicine

## 2021-05-15 ENCOUNTER — Ambulatory Visit (INDEPENDENT_AMBULATORY_CARE_PROVIDER_SITE_OTHER): Payer: Medicaid Other | Admitting: Internal Medicine

## 2021-05-15 VITALS — BP 96/62 | HR 87 | Ht 71.0 in | Wt 222.0 lb

## 2021-05-15 DIAGNOSIS — N186 End stage renal disease: Secondary | ICD-10-CM | POA: Diagnosis not present

## 2021-05-15 DIAGNOSIS — R9439 Abnormal result of other cardiovascular function study: Secondary | ICD-10-CM

## 2021-05-15 DIAGNOSIS — I10 Essential (primary) hypertension: Secondary | ICD-10-CM

## 2021-05-15 NOTE — Patient Instructions (Addendum)
Medication Instructions:  Your physician recommends that you continue on your current medications as directed. Please refer to the Current Medication list given to you today.  *If you need a refill on your cardiac medications before your next appointment, please call your pharmacy*   Lab Work: NONE If you have labs (blood work) drawn today and your tests are completely normal, you will receive your results only by: [place] (if you have MyChart) OR A paper copy in the mail If you have any lab test that is abnormal or we need to change your treatment, we will call you to review the results.   Testing/Procedures: Your physician has requested that you have en exercise stress myoview. For further information please visit [email or web address]. Please follow instruction sheet, as given.    Follow-Up: At [facility], you and your health needs are our priority.  As part of our continuing mission to provide you with exceptional heart care, we have created designated Provider Care Teams.  These Care Teams include your primary Cardiologist (physician) and Advanced Practice Providers (APPs -  Physician Assistants and Nurse Practitioners) who all work together to provide you with the care you need, when you need it.  We recommend signing up for the patient portal called "MyChart".  Sign up information is provided on this After Visit Summary.  MyChart is used to connect with patients for Virtual Visits (Telemedicine).  Patients are able to view lab/test results, encounter notes, upcoming appointments, etc.  Non-urgent messages can be sent to your provider as well.   To learn more about what you can do with MyChart, go to [email or web address].    Your next appointment:   6 month(s)  The format for your next appointment:   In Person  Provider:   You may see Werner Lean, MD or one of the following Advanced Practice Providers on your designated Care Team:   Melina Copa,  PA-C Ermalinda Barrios, PA-C   Other Instructions  You are scheduled for a Myocardial Perfusion Imaging Study. Please arrive 15 minutes prior to your appointment time for registration and insurance purposes.   The test will take approximately 3 to 4 hours to complete; you may bring reading material.  If someone comes with you to your appointment, they will need to remain in the main lobby due to limited space in the testing area.    How to prepare for your Myocardial Perfusion Test: Do not eat or drink 3 hours prior to your test, except you may have water. Do not consume products containing caffeine (regular or decaffeinated) 12 hours prior to your test. (ex: coffee, chocolate, sodas, tea). Do bring a list of your current medications with you.  If not listed below, you may take your medications as normal. Do wear comfortable clothes (no dresses or overalls) and walking shoes, tennis shoes preferred (No heels or open toe shoes are allowed). Do NOT wear cologne, perfume, aftershave, or lotions (deodorant is allowed). If these instructions are not followed, your test will have to be rescheduled.  If you cannot keep your appointment, please provide 24 hours notification to the Nuclear Lab, to avoid a possible $50 charge to your account.

## 2021-05-19 ENCOUNTER — Encounter (HOSPITAL_COMMUNITY): Payer: Self-pay | Admitting: *Deleted

## 2021-05-19 ENCOUNTER — Telehealth (HOSPITAL_COMMUNITY): Payer: Self-pay | Admitting: *Deleted

## 2021-05-19 NOTE — Telephone Encounter (Signed)
Letter sent via [employer] outlining instructions for stress test on 05/26/2021.

## 2021-05-26 ENCOUNTER — Ambulatory Visit (HOSPITAL_COMMUNITY): Payer: Medicaid Other | Attending: Cardiovascular Disease

## 2021-05-26 ENCOUNTER — Other Ambulatory Visit: Payer: Self-pay

## 2021-05-26 DIAGNOSIS — R9439 Abnormal result of other cardiovascular function study: Secondary | ICD-10-CM | POA: Insufficient documentation

## 2021-05-26 LAB — MYOCARDIAL PERFUSION IMAGING
Estimated workload: 5.8
Exercise duration (min): 4 min
Exercise duration (sec): 0 s
LV dias vol: 82 mL (ref 62–150)
LV sys vol: 31 mL
MPHR: 165 {beats}/min
Nuc Stress EF: 62 %
Peak HR: 153 {beats}/min
Percent HR: 92 %
Rest HR: 88 {beats}/min
Rest Nuclear Isotope Dose: 9.7 mCi
SDS: 2
SRS: 0
SSS: 2
ST Depression (mm): 0 mm
Stress Nuclear Isotope Dose: 30.1 mCi
TID: 0.83

## 2021-05-26 MED ORDER — TECHNETIUM TC 99M TETROFOSMIN IV KIT
9.7000 | PACK | Freq: Once | INTRAVENOUS | Status: AC | PRN
  Administered 2021-05-26: 9.7 via INTRAVENOUS
  Filled 2021-05-26: qty 10

## 2021-05-26 MED ORDER — TECHNETIUM TC 99M TETROFOSMIN IV KIT
30.1000 | PACK | Freq: Once | INTRAVENOUS | Status: AC | PRN
  Administered 2021-05-26: 30.1 via INTRAVENOUS
  Filled 2021-05-26: qty 31

## 2021-05-29 ENCOUNTER — Telehealth: Payer: Self-pay | Admitting: Internal Medicine

## 2021-05-29 NOTE — Telephone Encounter (Signed)
New message:     Patient calling to get results.

## 2021-05-31 NOTE — Telephone Encounter (Signed)
Results reviewed with pt on 05/29/21 please see result note.

## 2021-11-14 NOTE — Progress Notes (Unsigned)
Cardiology Office Note:    Date:  11/14/2021   ID:  Bernard Jordan, DOB 04/01/1966, MRN [id number]  PCP:  Antony Blackbird, MD (Inactive)   [facility] Providers Cardiologist:  Werner Lean, MD     Referring MD: No ref. provider found   CC: follow up low normal LVEF  History of Present Illness:    Bernard Jordan is a 56 y.o. male with a hx of HTN, ESRD and a recent stress test who presented for evaluation 2022.  Had low normal LVEF and normal perfusion in 2022.  At last eval he was on transplant list for kidney.  Patient notes that he is doing ***.   Since day prior/last visit notes *** . There are no*** interval hospital/ED visit.    No chest pain or pressure ***.  No SOB/DOE*** and no PND/Orthopnea***.  No weight gain or leg swelling***.  No palpitations or syncope ***.  Ambulatory blood pressure ***.   Past Medical History:  Diagnosis Date   AKI (acute kidney injury) ([place]) 05/08/2019   CKD (chronic kidney disease), stage IV ([place]) 05/08/2019   Polycystic kidney disease     Past Surgical History:  Procedure Laterality Date   BASCILIC VEIN TRANSPOSITION Left 05/14/2019   Procedure: BASILIC VEIN TRANSPOSITION;  Surgeon: Rosetta Posner, MD;  [facility];  Service: Vascular;  Laterality: Left;   [place] Left 07/06/2019   Procedure: SECOND STAGE BASILIC VEIN TRANSPOSITION LEFT ARM;  Surgeon: Rosetta Posner, MD;  Location: [place];  Service: Vascular;  Laterality: Left;   INSERTION OF DIALYSIS CATHETER Right 05/14/2019   Procedure: INSERTION OF TUNNELED DIALYSIS CATHETER;  Surgeon: Rosetta Posner, MD;  [facility];  Service: Vascular;  Laterality: Right;   shoulder surgery rotator cuff Left     Current Medications: No outpatient medications have been marked as taking for the 11/15/21 encounter (Appointment) with Werner Lean, MD.     Allergies:   Patient has no known allergies.   Social History   Socioeconomic History   Marital status:  Married    Spouse name: Not on file   Number of children: Not on file   Years of education: Not on file   Highest education level: Not on file  Occupational History   Not on file  Tobacco Use   Smoking status: Never   Smokeless tobacco: Never  Vaping Use   Vaping Use: Never used  Substance and Sexual Activity   Alcohol use: Not Currently   Drug use: Never   Sexual activity: Not Currently  Other Topics Concern   Not on file  Social History Narrative   Not on file   Social Determinants of Health   Financial Resource Strain: Not on file  Food Insecurity: Not on file  Transportation Needs: Not on file  Physical Activity: Not on file  Stress: Not on file  Social Connections: Not on file    Family History: The patient's family history includes Renal Disease in his mother. History of coronary artery disease notable for no members. History of heart failure notable for CHF in mother and may have had ICD.  ROS:   Please see the history of present illness.     All other systems reviewed and are negative.  EKGs/Labs/Other Studies Reviewed:    The following studies were reviewed today:  EKG:   05/15/21: SR 87 WNL  Transthoracic Echocardiogram: Date: 01/04/21 Results: The left ventricular size is normal.  Mild left ventricular  hypertrophy  Left ventricular systolic function is low normal.  LV ejection fraction = 50-55%.  Left ventricular filling pattern is prolonged relaxation.  The right ventricle is normal in size and function.  There is no significant valvular stenosis or regurgitation.  The IVC is normal in size with an inspiratory collapse of greater then  50%, suggesting normal right atrial pressure.  There is no pericardial effusion.  Probably no significant change in comparison with the prior study  noted    Stress Echo : Date: 01/04/21 Results: Report only STRESS ECHO  Normal left ventricular function and global wall motion with stress.  There was normal  increase in global LV function post exercise. The  estimated LV ejection fraction is >70% with stress. Negative exercise  echocardiography for inducible ischemia at target heart rate. The  sensitivity of the stress test might decrease due to poor exercise  capacity .   ECG or NM Stress Testing : Date: 05/26/21 Results: The study is normal. The study is low risk.   No ST deviation was noted.   Defect 1: There is a small defect with mild reduction in uptake present in the apex location(s) that is fixed. There is normal wall motion in the defect area. Consistent with artifact.   Nuclear stress EF: 62 %. The left ventricular ejection fraction is normal (55-65%). Left ventricular function is normal.   Low risk stress nuclear study with normal perfusion and normal left ventricular regional and global systolic function.  Recent Labs: No results found for requested labs within last 8760 hours.  Recent Lipid Panel No results found for: CHOL, TRIG, HDL, CHOLHDL, VLDL, LDLCALC, LDLDIRECT   Physical Exam:    VS:  There were no vitals taken for this visit.    Wt Readings from Last 3 Encounters:  05/26/21 100.7 kg  05/15/21 100.7 kg  12/09/19 97.5 kg     Gen: *** distress, *** obese/well nourished/malnourished   Neck: No JVD, *** carotid bruit Ears: Pilar Plate Sign Cardiac: No Rubs or Gallops, *** Murmur, ***cardia, *** radial pulses Respiratory: Clear to auscultation bilaterally, *** effort, ***  respiratory rate GI: Soft, nontender, non-distended *** MS: No *** edema; *** moves all extremities Integument: Skin feels *** Neuro:  At time of evaluation, alert and oriented to person/place/time/situation *** Psych: Normal affect, patient feels ***   ASSESSMENT:    No diagnosis found.  PLAN:    ESRD seeing [facility] and getting kidney transplant eval at [facility] Abnormal NM Stress test - abnormality is low normal EF - will get exercise NM Stress for clarification on cardiac risk prior  to kidney transplant - midodrine per nephrology    Medication Adjustments/Labs and Tests Ordered: Current medicines are reviewed at length with the patient today.  Concerns regarding medicines are outlined above.  No orders of the defined types were placed in this encounter.   No orders of the defined types were placed in this encounter.    There are no Patient Instructions on file for this visit.   Signed, Werner Lean, MD  11/14/2021 1:22 PM    [place]

## 2021-11-15 ENCOUNTER — Ambulatory Visit: Payer: Medicare Other | Admitting: Internal Medicine

## 2022-03-05 ENCOUNTER — Telehealth: Payer: Self-pay | Admitting: Internal Medicine

## 2022-03-05 NOTE — Telephone Encounter (Signed)
Natalia, Nephrologist with [facility] would like to speak to Dr. Gasper Sells in regards to this patient.  She has some concerns she would like to discuss with him.

## 2022-03-06 NOTE — Telephone Encounter (Signed)
Left a message to call back.  Need to schedule an OV with Dr. Gasper Sells at next available.  Nephrologist called with concerns pt may need a LHC.

## 2022-03-06 NOTE — Telephone Encounter (Signed)
Patient returned call and scheduled for 8/09 with Dr. Gasper Sells.

## 2022-03-06 NOTE — Telephone Encounter (Signed)
Attempted to call Bernard Jordan to see what information is needed from Dr. Gasper Sells.

## 2022-03-06 NOTE — Telephone Encounter (Signed)
Discussed care with Dr. Elane Fritz, [facility].  Patient presented with asymptomatic hypotension, BP 78/46.  At HD they have been checking BP on legs, can have BP SBP 90 during HD.  He is presently not a candidate for renal transplant, but could be if he completes a full work up for hypotension that include LHC.  Will bring back for [facility] discussion and route results to [facility]phrology  Patient had no further questions.  Rudean Haskell, MD [place], [address] [place], [place] [address] [phone]  9:20 AM

## 2022-03-09 NOTE — Telephone Encounter (Signed)
Left a message to call back.  Need to offer pt a sooner OV.  Was going to offer DOD 6/16 with Dr. Acie Fredrickson.  Pt needs LHC per request of nephrologist with kidney transplant team..

## 2022-03-15 ENCOUNTER — Encounter: Payer: Self-pay | Admitting: Cardiovascular Disease

## 2022-03-15 NOTE — Progress Notes (Signed)
Cardiology Office Note:    Date:  03/16/2022   ID:  Colon Flattery, DOB 1966-05-18, MRN [id number]  PCP:  Antony Blackbird, MD   [facility] Providers Cardiologist:  Werner Lean, MD   Referring MD: Antony Blackbird, MD   Chief Complaint  Patient presents with   Hypertension    History of Present Illness:    Bernard Jordan is a 56 y.o. male with a hx of HTN, ESRD  We were asked to see him to arrange cardiac cath  for possible renal transplant   No chest pains any shortness of breath.  He sees nephrology at [facility].  He has chronic hypotension. He is enrolled in the renal transplant program.  He is not presently a candidate for renal transplant but according to his nephrologist, a he could be a candidate if he completes a full work-up for his hypotension including a left heart catheterization.  Has been on dialysis for 3-1/2 years.  His blood pressure was okay until he started dialysis.  He has had his dry weight increased.  There is still evaluating whether or not that will help  No weakness, dizziness. Is careful with salt and wat  Past Medical History:  Diagnosis Date   AKI (acute kidney injury) ([place]) 05/08/2019   CKD (chronic kidney disease), stage IV ([place]) 05/08/2019   Polycystic kidney disease     Past Surgical History:  Procedure Laterality Date   BASCILIC VEIN TRANSPOSITION Left 05/14/2019   Procedure: BASILIC VEIN TRANSPOSITION;  Surgeon: Rosetta Posner, MD;  Location: [place];  Service: Vascular;  Laterality: Left;   [place] Left 07/06/2019   Procedure: SECOND STAGE BASILIC VEIN TRANSPOSITION LEFT ARM;  Surgeon: Rosetta Posner, MD;  Location: [place];  Service: Vascular;  Laterality: Left;   INSERTION OF DIALYSIS CATHETER Right 05/14/2019   Procedure: INSERTION OF TUNNELED DIALYSIS CATHETER;  Surgeon: Rosetta Posner, MD;  Location: [facility];  Service: Vascular;  Laterality: Right;   shoulder surgery rotator cuff Left     Current  Medications: Current Meds  Medication Sig   acetaminophen (TYLENOL) 500 MG tablet Take by mouth.   azaTHIOprine (IMURAN) 50 MG tablet Take 100 mg by mouth daily.   b complex-vitamin c-folic acid (NEPHRO-VITE) 0.8 MG TABS tablet Take by mouth.   brimonidine (ALPHAGAN) 0.2 % ophthalmic solution 1 drop daily.   calcitRIOL (ROCALTROL) 0.5 MCG capsule Take by mouth daily.   Cyanocobalamin (B-12 DOTS) 500 MCG TBDP Take by mouth daily.   dorzolamide-timolol (COSOPT) 22.3-6.8 MG/ML ophthalmic solution Place 1 drop into the right eye 3 (three) times daily.   ferric citrate (AURYXIA) 1 GM 210 MG(Fe) tablet Take by mouth.   FOSRENOL 500 MG chewable tablet Chew 1,000 mg by mouth 3 (three) times daily.   K Phos Mono-Sod Phos Di & Mono (PHOSPHA 250 NEUTRAL) [phone] MG TABS Take by mouth.   lidocaine-prilocaine (EMLA) cream Apply 1 a small amount to skin as directed as directed Apply to access 1/2hr-1hr prior to treatment, cover with saran wrap   midodrine (PROAMATINE) 10 MG tablet Take 1 tablet by mouth as directed as directed 3x weekly on HD days and one dose 2 hrs into treatment   multivitamin (RENA-VIT) TABS tablet Take by mouth.   ofloxacin (OCUFLOX) 0.3 % ophthalmic solution Place 1 drop into the right eye 4 (four) times daily.   Olopatadine HCl 0.2 % SOLN daily.   prednisoLONE acetate (PRED FORTE) 1 % ophthalmic suspension Place 1  drop into the right eye 4 (four) times daily.   RENVELA 800 MG tablet Take 1,600 mg by mouth 3 (three) times daily.     Allergies:   Patient has no known allergies.   Social History   Socioeconomic History   Marital status: Married    Spouse name: Not on file   Number of children: Not on file   Years of education: Not on file   Highest education level: Not on file  Occupational History   Not on file  Tobacco Use   Smoking status: Never   Smokeless tobacco: Never  Vaping Use   Vaping Use: Never used  Substance and Sexual Activity   Alcohol use: Not  Currently   Drug use: Never   Sexual activity: Not Currently  Other Topics Concern   Not on file  Social History Narrative   Not on file   Social Determinants of Health   Financial Resource Strain: Not on file  Food Insecurity: Not on file  Transportation Needs: Not on file  Physical Activity: Not on file  Stress: Not on file  Social Connections: Not on file     Family History: The patient's family history includes Renal Disease in his mother.  ROS:   Please see the history of present illness.     All other systems reviewed and are negative.  EKGs/Labs/Other Studies Reviewed:    The following studies were reviewed today:   EKG:   Recent Labs: No results found for requested labs within last 365 days.  Recent Lipid Panel No results found for: "CHOL", "TRIG", "HDL", "CHOLHDL", "VLDL", "LDLCALC", "LDLDIRECT"   Risk Assessment/Calculations:           Physical Exam:    VS:  BP (!) 68/44   Pulse 79   Ht 5\' 11"  (1.803 m)   Wt 220 lb (99.8 kg)   SpO2 99%   BMI 30.68 kg/m     Wt Readings from Last 3 Encounters:  03/16/22 220 lb (99.8 kg)  05/26/21 222 lb (100.7 kg)  05/15/21 222 lb (100.7 kg)     GEN:  Well nourished, well developed in no acute distress HEENT: Normal NECK: No JVD; No carotid bruits LYMPHATICS: No lymphadenopathy CARDIAC: RRR, no murmurs, rubs, gallops RESPIRATORY:  Clear to auscultation without rales, wheezing or rhonchi  ABDOMEN: Soft, non-tender, non-distended MUSCULOSKELETAL:  No edema; No deformity  SKIN: Warm and dry NEUROLOGIC:  Alert and oriented x 3 PSYCHIATRIC:  Normal affect   ASSESSMENT:    1. Severe hypotension   2. Preoperative cardiovascular examination   3. Pre-transplant evaluation for chronic kidney disease    PLAN:    In order of problems listed above:  Hypotension / pre-transplant: He is renal transplant surgeon has requested a heart catheterization for further evaluation of his hypertension.  Will arrange for  right and left heart catheterization.  I would like to also get an echocardiogram prior to the cath so that we have an understanding of what his LV function and valvular function is.  We have discussed the risk, benefits, options of heart catheterization.  He understands and agrees to proceed.   2.  End-stage renal disease: Continue dialysis.  I suggested that he may need a slightly higher dry weight since he has had chronic hypotension.  He is very careful with his water and salt intake.  We will leave these decisions up to his nephrologist.      Shared Decision Making/Informed Consent The risks [stroke (1 in  1000), death (1 in 14), kidney failure [usually temporary] (1 in 500), bleeding (1 in 200), allergic reaction [possibly serious] (1 in 200)], benefits (diagnostic support and management of coronary artery disease) and alternatives of a cardiac catheterization were discussed in detail with Mr. Finigan and he is willing to proceed.    Medication Adjustments/Labs and Tests Ordered: Current medicines are reviewed at length with the patient today.  Concerns regarding medicines are outlined above.  Orders Placed This Encounter  Procedures   CBC   Basic metabolic panel   ECHOCARDIOGRAM COMPLETE   No orders of the defined types were placed in this encounter.   Patient Instructions  Medication Instructions:  Your physician recommends that you continue on your current medications as directed. Please refer to the Current Medication list given to you today.  *If you need a refill on your cardiac medications before your next appointment, please call your pharmacy*   Lab Work: CBC, BMET on 6/23 If you have labs (blood work) drawn today and your tests are completely normal, you will receive your results only by: [place] (if you have MyChart) OR A paper copy in the mail If you have any lab test that is abnormal or we need to change your treatment, we will call you to review the  results.   Testing/Procedures: ECHO - 03/23/22@7 :69 Your physician has requested that you have an echocardiogram. Echocardiography is a painless test that uses sound waves to create images of your heart. It provides your doctor with information about the size and shape of your heart and how well your heart's chambers and valves are working. This procedure takes approximately one hour. There are no restrictions for this procedure.  [place] physician has requested that you have a cardiac catheterization. Cardiac catheterization is used to diagnose and/or treat various heart conditions. Doctors may recommend this procedure for a number of different reasons. The most common reason is to evaluate chest pain. Chest pain can be a symptom of coronary artery disease (CAD), and cardiac catheterization can show whether plaque is narrowing or blocking your heart's arteries. This procedure is also used to evaluate the valves, as well as measure the blood flow and oxygen levels in different parts of your heart. For further information please visit HugeFiesta.[email or web address]. Please follow instruction sheet, as given.  Follow-Up: At [facility], you and your health needs are our priority.  As part of our continuing mission to provide you with exceptional heart care, we have created designated Provider Care Teams.  These Care Teams include your primary Cardiologist (physician) and Advanced Practice Providers (APPs -  Physician Assistants and Nurse Practitioners) who all work together to provide you with the care you need, when you need it.  We recommend signing up for the patient portal called "MyChart".  Sign up information is provided on this After Visit Summary.  MyChart is used to connect with patients for Virtual Visits (Telemedicine).  Patients are able to view lab/test results, encounter notes, upcoming appointments, etc.  Non-urgent messages can be sent to your provider as well.   To learn more about  what you can do with MyChart, go to [email or web address].    Your next appointment:   3 month(s)  The format for your next appointment:   In Person  Provider:   Gasper Sells, MD  Other [place] OFFICE [place], [place] [address] Dept: [phone] Loc:  [place]  03/16/2022  You are scheduled for a Cardiac Catheterization on Monday, June 26 with Dr. Glenetta Hew.  1. Please arrive at the Main Entrance A at [facility]: [place], [place] [address] at 7:00 AM (This time is two hours before your procedure to ensure your preparation). Free valet parking service is available.   Special note: Every effort is made to have your procedure done on time. Please understand that emergencies sometimes delay scheduled procedures.  2. Diet: Do not eat solid foods after midnight.  You may have clear liquids until 5 AM upon the day of the procedure.  3. Labs: You will need to have blood drawn on Friday, June 23 at [facility] at [facility]. [address]  Open: 7:30am - 5pm    Phone: [phone]. You do not need to be fasting.  4. Medication instructions in preparation for your procedure:   Contrast Allergy: No  On the morning of your procedure, take Aspirin 81mg  and any morning medicines NOT listed above.  You may use sips of water.  5. Plan to go home the same day, you will only stay overnight if medically necessary. 6. You MUST have a responsible adult to drive you home. 7. An adult MUST be with you the first 24 hours after you arrive home. 8. Bring a current list of your medications, and the last time and date medication taken. 9. Bring ID and current insurance cards. 10.Please wear clothes that are easy to get on and off and wear slip-on shoes.  Thank you for allowing [place] to care for you!   -- [facility] Invasive Cardiovascular services   Important Information About Sugar         Signed, Mertie Moores, MD  03/16/2022 6:25 PM    [place]

## 2022-03-16 ENCOUNTER — Encounter: Payer: Self-pay | Admitting: Cardiovascular Disease

## 2022-03-16 ENCOUNTER — Ambulatory Visit (INDEPENDENT_AMBULATORY_CARE_PROVIDER_SITE_OTHER): Payer: Medicare Other | Admitting: Cardiovascular Disease

## 2022-03-16 VITALS — BP 68/44 | HR 79 | Ht 71.0 in | Wt 220.0 lb

## 2022-03-16 DIAGNOSIS — I959 Hypotension, unspecified: Secondary | ICD-10-CM | POA: Diagnosis not present

## 2022-03-16 DIAGNOSIS — Z01810 Encounter for preprocedural cardiovascular examination: Secondary | ICD-10-CM | POA: Diagnosis not present

## 2022-03-16 DIAGNOSIS — Z01818 Encounter for other preprocedural examination: Secondary | ICD-10-CM

## 2022-03-16 NOTE — Patient Instructions (Signed)
Medication Instructions:  Your physician recommends that you continue on your current medications as directed. Please refer to the Current Medication list given to you today.  *If you need a refill on your cardiac medications before your next appointment, please call your pharmacy*   Lab Work: CBC, BMET on 6/23 If you have labs (blood work) drawn today and your tests are completely normal, you will receive your results only by: [place] (if you have MyChart) OR A paper copy in the mail If you have any lab test that is abnormal or we need to change your treatment, we will call you to review the results.   Testing/Procedures: ECHO - 03/23/22@7 :37 Your physician has requested that you have an echocardiogram. Echocardiography is a painless test that uses sound waves to create images of your heart. It provides your doctor with information about the size and shape of your heart and how well your heart's chambers and valves are working. This procedure takes approximately one hour. There are no restrictions for this procedure.  [place] physician has requested that you have a cardiac catheterization. Cardiac catheterization is used to diagnose and/or treat various heart conditions. Doctors may recommend this procedure for a number of different reasons. The most common reason is to evaluate chest pain. Chest pain can be a symptom of coronary artery disease (CAD), and cardiac catheterization can show whether plaque is narrowing or blocking your heart's arteries. This procedure is also used to evaluate the valves, as well as measure the blood flow and oxygen levels in different parts of your heart. For further information please visit HugeFiesta.[email or web address]. Please follow instruction sheet, as given.  Follow-Up: At [facility], you and your health needs are our priority.  As part of our continuing mission to provide you with exceptional heart care, we have created designated Provider  Care Teams.  These Care Teams include your primary Cardiologist (physician) and Advanced Practice Providers (APPs -  Physician Assistants and Nurse Practitioners) who all work together to provide you with the care you need, when you need it.  We recommend signing up for the patient portal called "MyChart".  Sign up information is provided on this After Visit Summary.  MyChart is used to connect with patients for Virtual Visits (Telemedicine).  Patients are able to view lab/test results, encounter notes, upcoming appointments, etc.  Non-urgent messages can be sent to your provider as well.   To learn more about what you can do with MyChart, go to [email or web address].    Your next appointment:   3 month(s)  The format for your next appointment:   In Person  Provider:   Gasper Sells, MD  Other Instructions  [place] OFFICE [place], [place] [address] Dept: [place]: [place]  03/16/2022  You are scheduled for a Cardiac Catheterization on Monday, June 26 with Dr. Glenetta Hew.  1. Please arrive at the Main Entrance A at [facility]: [place], [place] [address] at 7:00 AM (This time is two hours before your procedure to ensure your preparation). Free valet parking service is available.   Special note: Every effort is made to have your procedure done on time. Please understand that emergencies sometimes delay scheduled procedures.  2. Diet: Do not eat solid foods after midnight.  You may have clear liquids until 5 AM upon the day of the procedure.  3.  Labs: You will need to have blood drawn on Friday, June 23 at [facility]. [address]  Open: 7:30am - 5pm    Phone: [phone]. You do not need to be fasting.  4. Medication instructions in preparation for your procedure:   Contrast  Allergy: No  On the morning of your procedure, take Aspirin 81mg  and any morning medicines NOT listed above.  You may use sips of water.  5. Plan to go home the same day, you will only stay overnight if medically necessary. 6. You MUST have a responsible adult to drive you home. 7. An adult MUST be with you the first 24 hours after you arrive home. 8. Bring a current list of your medications, and the last time and date medication taken. 9. Bring ID and current insurance cards. 10.Please wear clothes that are easy to get on and off and wear slip-on shoes.  Thank you for allowing [place] to care for you!   -- [place] Invasive Cardiovascular services   Important Information About Sugar

## 2022-03-22 ENCOUNTER — Telehealth: Payer: Self-pay | Admitting: *Deleted

## 2022-03-22 NOTE — Telephone Encounter (Addendum)
Cardiac Catheterization scheduled at [facility] for: Monday March 26, 2022 9 AM Arrival time and place: [place] Entrance A at: 7 AM Tu-Th-Sat dialysis  Nothing to eat after midnight prior to procedure, clear liquids until 5 AM day of procedure.   Medication instructions: -Usual morning medications can be taken with sips of water including aspirin 81 mg.  Confirmed patient has responsible adult to drive home post procedure and be with patient first 24 hours after arriving home.  Patient reports no new symptoms concerning for COVID-19 in the past 10 days.  Reviewed procedure instructions with patient.

## 2022-03-23 ENCOUNTER — Ambulatory Visit (HOSPITAL_COMMUNITY): Payer: Medicare Other | Attending: Cardiology

## 2022-03-23 ENCOUNTER — Other Ambulatory Visit: Payer: Medicare Other

## 2022-03-23 DIAGNOSIS — Z01818 Encounter for other preprocedural examination: Secondary | ICD-10-CM

## 2022-03-23 DIAGNOSIS — I959 Hypotension, unspecified: Secondary | ICD-10-CM | POA: Diagnosis not present

## 2022-03-23 DIAGNOSIS — Z01810 Encounter for preprocedural cardiovascular examination: Secondary | ICD-10-CM

## 2022-03-23 LAB — ECHOCARDIOGRAM COMPLETE
Area-P 1/2: 6.27 cm2
S' Lateral: 2.7 cm

## 2022-03-23 MED ORDER — PERFLUTREN LIPID MICROSPHERE
1.0000 mL | INTRAVENOUS | Status: AC | PRN
  Administered 2022-03-23: 2 mL via INTRAVENOUS

## 2022-03-24 LAB — CBC
Hematocrit: 36.3 % — ABNORMAL LOW (ref 37.5–51.0)
Hemoglobin: 12.4 g/dL — ABNORMAL LOW (ref 13.0–17.7)
MCH: 34.6 pg — ABNORMAL HIGH (ref 26.6–33.0)
MCHC: 34.2 g/dL (ref 31.5–35.7)
MCV: 101 fL — ABNORMAL HIGH (ref 79–97)
Platelets: 239 10*3/uL (ref 150–450)
RBC: 3.58 x10E6/uL — ABNORMAL LOW (ref 4.14–5.80)
RDW: 14.1 % (ref 11.6–15.4)
WBC: 5.6 10*3/uL (ref 3.4–10.8)

## 2022-03-24 LAB — BASIC METABOLIC PANEL
BUN/Creatinine Ratio: 3 — ABNORMAL LOW (ref 9–20)
BUN: 24 mg/dL (ref 6–24)
CO2: 32 mmol/L — ABNORMAL HIGH (ref 20–29)
Calcium: 8.5 mg/dL — ABNORMAL LOW (ref 8.7–10.2)
Chloride: 94 mmol/L — ABNORMAL LOW (ref 96–106)
Creatinine, Ser: 6.95 mg/dL — ABNORMAL HIGH (ref 0.76–1.27)
Glucose: 82 mg/dL (ref 70–99)
Potassium: 4.2 mmol/L (ref 3.5–5.2)
Sodium: 142 mmol/L (ref 134–144)
eGFR: 9 mL/min/{1.73_m2} — ABNORMAL LOW (ref >59)

## 2022-03-26 ENCOUNTER — Other Ambulatory Visit: Payer: Self-pay

## 2022-03-26 ENCOUNTER — Encounter (HOSPITAL_COMMUNITY)
Admission: RE | Disposition: A | Discharge status: Home / Self Care | Payer: Self-pay | Source: Home / Self Care | Attending: Cardiology

## 2022-03-26 ENCOUNTER — Ambulatory Visit (HOSPITAL_COMMUNITY)
Admission: RE | Admit: 2022-03-26 | Discharge: 2022-03-26 | Disposition: A | Discharge status: Home / Self Care | Payer: Medicare Other | Attending: Cardiology | Admitting: Cardiology

## 2022-03-26 DIAGNOSIS — Z992 Dependence on renal dialysis: Secondary | ICD-10-CM | POA: Diagnosis not present

## 2022-03-26 DIAGNOSIS — Z01818 Encounter for other preprocedural examination: Secondary | ICD-10-CM

## 2022-03-26 DIAGNOSIS — I120 Hypertensive chronic kidney disease with stage 5 chronic kidney disease or end stage renal disease: Secondary | ICD-10-CM | POA: Diagnosis not present

## 2022-03-26 DIAGNOSIS — I9589 Other hypotension: Secondary | ICD-10-CM | POA: Diagnosis not present

## 2022-03-26 DIAGNOSIS — N186 End stage renal disease: Secondary | ICD-10-CM | POA: Diagnosis not present

## 2022-03-26 DIAGNOSIS — I959 Hypotension, unspecified: Secondary | ICD-10-CM | POA: Insufficient documentation

## 2022-03-26 DIAGNOSIS — Z01810 Encounter for preprocedural cardiovascular examination: Secondary | ICD-10-CM | POA: Insufficient documentation

## 2022-03-26 HISTORY — PX: RIGHT/LEFT HEART CATH AND CORONARY ANGIOGRAPHY: CATH118266

## 2022-03-26 LAB — POCT I-STAT EG7
Acid-Base Excess: 9 mmol/L — ABNORMAL HIGH (ref 0.0–2.0)
Acid-Base Excess: 9 mmol/L — ABNORMAL HIGH (ref 0.0–2.0)
Bicarbonate: 33.4 mmol/L — ABNORMAL HIGH (ref 20.0–28.0)
Bicarbonate: 34 mmol/L — ABNORMAL HIGH (ref 20.0–28.0)
Calcium, Ion: 1.04 mmol/L — ABNORMAL LOW (ref 1.15–1.40)
Calcium, Ion: 1.06 mmol/L — ABNORMAL LOW (ref 1.15–1.40)
HCT: 33 % — ABNORMAL LOW (ref 39.0–52.0)
HCT: 33 % — ABNORMAL LOW (ref 39.0–52.0)
Hemoglobin: 11.2 g/dL — ABNORMAL LOW (ref 13.0–17.0)
Hemoglobin: 11.2 g/dL — ABNORMAL LOW (ref 13.0–17.0)
O2 Saturation: 71 %
O2 Saturation: 74 %
Potassium: 4.3 mmol/L (ref 3.5–5.1)
Potassium: 4.3 mmol/L (ref 3.5–5.1)
Sodium: 137 mmol/L (ref 135–145)
Sodium: 137 mmol/L (ref 135–145)
TCO2: 35 mmol/L — ABNORMAL HIGH (ref 22–32)
TCO2: 35 mmol/L — ABNORMAL HIGH (ref 22–32)
pCO2, Ven: 45.1 mmHg (ref 44–60)
pCO2, Ven: 46 mmHg (ref 44–60)
pH, Ven: 7.477 — ABNORMAL HIGH (ref 7.25–7.43)
pH, Ven: 7.478 — ABNORMAL HIGH (ref 7.25–7.43)
pO2, Ven: 35 mmHg (ref 32–45)
pO2, Ven: 37 mmHg (ref 32–45)

## 2022-03-26 LAB — POCT I-STAT 7, (LYTES, BLD GAS, ICA,H+H)
Acid-Base Excess: 9 mmol/L — ABNORMAL HIGH (ref 0.0–2.0)
Bicarbonate: 32.6 mmol/L — ABNORMAL HIGH (ref 20.0–28.0)
Calcium, Ion: 1.04 mmol/L — ABNORMAL LOW (ref 1.15–1.40)
HCT: 33 % — ABNORMAL LOW (ref 39.0–52.0)
Hemoglobin: 11.2 g/dL — ABNORMAL LOW (ref 13.0–17.0)
O2 Saturation: 97 %
Potassium: 4.3 mmol/L (ref 3.5–5.1)
Sodium: 136 mmol/L (ref 135–145)
TCO2: 34 mmol/L — ABNORMAL HIGH (ref 22–32)
pCO2 arterial: 42.2 mmHg (ref 32–48)
pH, Arterial: 7.497 — ABNORMAL HIGH (ref 7.35–7.45)
pO2, Arterial: 81 mmHg — ABNORMAL LOW (ref 83–108)

## 2022-03-26 SURGERY — RIGHT/LEFT HEART CATH AND CORONARY ANGIOGRAPHY
Anesthesia: LOCAL

## 2022-03-26 MED ORDER — SODIUM CHLORIDE 0.9% FLUSH: 3.0000 mL | INTRAVENOUS | Status: DC | PRN

## 2022-03-26 MED ORDER — IOHEXOL 350 MG/ML SOLN
INTRAVENOUS | Status: DC | PRN
  Administered 2022-03-26: 40 mL

## 2022-03-26 MED ORDER — HEPARIN (PORCINE) IN NACL 1000-0.9 UT/500ML-% IV SOLN
INTRAVENOUS | Status: DC | PRN
  Administered 2022-03-26 (×2): 500 mL

## 2022-03-26 MED ORDER — HYDRALAZINE HCL 20 MG/ML IJ SOLN: 10.0000 mg | INTRAMUSCULAR | Status: DC | PRN

## 2022-03-26 MED ORDER — LIDOCAINE HCL (PF) 1 % IJ SOLN
INTRAMUSCULAR | Status: DC | PRN
  Administered 2022-03-26: 10 mL

## 2022-03-26 MED ORDER — SODIUM CHLORIDE 0.9% FLUSH: 3.0000 mL | Freq: Two times a day (BID) | INTRAVENOUS | Status: DC

## 2022-03-26 MED ORDER — ACETAMINOPHEN 325 MG PO TABS: 650.0000 mg | ORAL_TABLET | ORAL | Status: DC | PRN

## 2022-03-26 MED ORDER — LABETALOL HCL 5 MG/ML IV SOLN: 10.0000 mg | INTRAVENOUS | Status: DC | PRN

## 2022-03-26 MED ORDER — SODIUM CHLORIDE 0.9 % IV BOLUS
250.0000 mL | Freq: Once | INTRAVENOUS | Status: AC
  Administered 2022-03-26: 250 mL via INTRAVENOUS

## 2022-03-26 MED ORDER — SODIUM CHLORIDE 0.9 % IV SOLN: 250.0000 mL | INTRAVENOUS | Status: DC | PRN

## 2022-03-26 MED ORDER — MIDODRINE HCL 10 MG PO TABS: ORAL_TABLET | ORAL | 3 refills | Status: AC

## 2022-03-26 MED ORDER — SODIUM CHLORIDE 0.9% FLUSH
3.0000 mL | Freq: Two times a day (BID) | INTRAVENOUS | Status: DC
Start: 2022-03-26 — End: 2022-03-26

## 2022-03-26 MED ORDER — ASPIRIN 81 MG PO CHEW: 81.0000 mg | CHEWABLE_TABLET | ORAL | Status: DC

## 2022-03-26 MED ORDER — MIDODRINE HCL 5 MG PO TABS
10.0000 mg | ORAL_TABLET | Freq: Once | ORAL | Status: AC
Start: 2022-03-26 — End: 2022-03-26
  Administered 2022-03-26: 10 mg via ORAL
  Filled 2022-03-26: qty 2

## 2022-03-26 MED ORDER — LIDOCAINE HCL (PF) 1 % IJ SOLN
INTRAMUSCULAR | Status: AC
  Filled 2022-03-26: qty 30

## 2022-03-26 MED ORDER — ONDANSETRON HCL 4 MG/2ML IJ SOLN: 4.0000 mg | Freq: Four times a day (QID) | INTRAMUSCULAR | Status: DC | PRN

## 2022-03-26 MED ORDER — SODIUM CHLORIDE 0.9 % IV SOLN
INTRAVENOUS | Status: AC | PRN
  Administered 2022-03-26: 250 mL via INTRAVENOUS

## 2022-03-26 MED ORDER — SODIUM CHLORIDE 0.9 % IV SOLN: INTRAVENOUS | Status: DC

## 2022-03-26 MED ORDER — HEPARIN (PORCINE) IN NACL 1000-0.9 UT/500ML-% IV SOLN
INTRAVENOUS | Status: AC
  Filled 2022-03-26: qty 1000

## 2022-03-26 SURGICAL SUPPLY — 12 items
CATH INFINITI 5FR MULTPACK ANG (CATHETERS) ×1 IMPLANT
CATH SWAN GANZ 7F STRAIGHT (CATHETERS) ×1 IMPLANT
KIT HEART LEFT (KITS) ×2 IMPLANT
PACK CARDIAC CATHETERIZATION (CUSTOM PROCEDURE TRAY) ×2 IMPLANT
SHEATH PINNACLE 5F 10CM (SHEATH) ×1 IMPLANT
SHEATH PINNACLE 7F 10CM (SHEATH) ×1 IMPLANT
SHEATH PROBE COVER 6X72 (BAG) ×1 IMPLANT
TRANSDUCER W/STOPCOCK (MISCELLANEOUS) ×2 IMPLANT
TUBING CIL FLEX 10 FLL-RA (TUBING) ×2 IMPLANT
WIRE EMERALD 3MM-J .025X260CM (WIRE) ×1 IMPLANT
WIRE EMERALD 3MM-J .035X260CM (WIRE) ×1 IMPLANT
WIRE MICRO SET SILHO 5FR 7 (SHEATH) ×1 IMPLANT

## 2022-03-26 NOTE — Interval H&P Note (Signed)
History and Physical Interval Note:  03/26/2022 7:33 AM  Bernard Jordan  has presented today for surgery, with the diagnosis of severe hypotension - Pre-Op Renal Transplant..  The various methods of treatment have been discussed with the patient and family. After consideration of risks, benefits and other options for treatment, the patient has consented to  Procedure(s): RIGHT/LEFT HEART CATH AND CORONARY ANGIOGRAPHY (N/A)  PERCUTANEOUS CORONARY INTERVENTION  as a surgical intervention.  The patient's history has been reviewed, patient examined, no change in status, stable for surgery.  I have reviewed the patient's chart and labs.  Questions were answered to the patient's satisfaction.    Cath Lab Visit (complete for each Cath Lab visit)  Clinical Evaluation Leading to the Procedure:   ACS: No.  Non-ACS:    Anginal Classification: No Symptoms  Anti-ischemic medical therapy: Minimal Therapy (1 class of medications)  Non-Invasive Test Results: Equivocal test results - NEED FOR CV EVALUATION PRE-RENAL TRANSPLANT  Prior CABG: No previous CABG    Bryan Lemma

## 2022-03-27 ENCOUNTER — Encounter (HOSPITAL_COMMUNITY): Payer: Self-pay | Admitting: Cardiology

## 2022-03-27 NOTE — Telephone Encounter (Signed)
Left a message to call back. Letter sent to pt advising to call the office.

## 2022-05-09 ENCOUNTER — Ambulatory Visit: Payer: Medicare Other | Admitting: Internal Medicine

## 2022-06-05 NOTE — Progress Notes (Unsigned)
Cardiology Office Note:    Date:  06/06/2022   ID:  Bernard Jordan, DOB 11/18/1965, MRN [id number]  PCP:  Antony Blackbird, MD   [facility] Providers Cardiologist:  Werner Lean, MD     Referring MD: Antony Blackbird, MD   CC: Follow Up blood pressure  History of Present Illness:    Bernard Jordan is a 56 y.o. male with a hx of HTN, ESRD and a recent stress test who presents for evaluation 05/15/21. 2023: saw transplant nephrology; they wanted a heart catheterization for consideration of renal transplant.  Had normal LHC and RHC.  Patient notes that he is doing fine.   Since last visit notes had normal cath . There are no interval hospital/ED visit.    No chest pain or pressure .  No SOB/DOE and no PND/Orthopnea.  No weight gain or leg swelling.  No palpitations or syncope.  He notes that since he started [facility] has had had low BP.  He is frustrated- he has no symptoms. They have adjusted his dry weight.  He feels once he gets a kidney his BP would resolve.  Past Medical History:  Diagnosis Date   AKI (acute kidney injury) ([place]) 05/08/2019   CKD (chronic kidney disease), stage IV ([place]) 05/08/2019   Polycystic kidney disease     Past Surgical History:  Procedure Laterality Date   BASCILIC VEIN TRANSPOSITION Left 05/14/2019   Procedure: BASILIC VEIN TRANSPOSITION;  Surgeon: Rosetta Posner, MD;  [facility];  Service: Vascular;  Laterality: Left;   [place] Left 07/06/2019   Procedure: SECOND STAGE BASILIC VEIN TRANSPOSITION LEFT ARM;  Surgeon: Rosetta Posner, MD;  Location: [place];  Service: Vascular;  Laterality: Left;   INSERTION OF DIALYSIS CATHETER Right 05/14/2019   Procedure: INSERTION OF TUNNELED DIALYSIS CATHETER;  Surgeon: Rosetta Posner, MD;  [facility] OR;  Service: Vascular;  Laterality: Right;   RIGHT/LEFT HEART CATH AND CORONARY ANGIOGRAPHY N/A 03/26/2022   Procedure: RIGHT/LEFT HEART CATH AND CORONARY ANGIOGRAPHY;  Surgeon: Leonie Man, MD;   Location: [place] CV LAB;  Service: Cardiovascular;  Laterality: N/A;   shoulder surgery rotator cuff Left     Current Medications: Current Meds  Medication Sig   acetaminophen (TYLENOL) 500 MG tablet Take by mouth.   azaTHIOprine (IMURAN) 50 MG tablet Take 100 mg by mouth daily.   b complex-vitamin c-folic acid (NEPHRO-VITE) 0.8 MG TABS tablet Take by mouth.   brimonidine (ALPHAGAN) 0.2 % ophthalmic solution 1 drop daily.   calcitRIOL (ROCALTROL) 0.5 MCG capsule Take by mouth daily.   Cyanocobalamin (B-12 DOTS) 500 MCG TBDP Take by mouth daily.   dorzolamide-timolol (COSOPT) 22.3-6.8 MG/ML ophthalmic solution Place 1 drop into the right eye 3 (three) times daily.   ferric citrate (AURYXIA) 1 GM 210 MG(Fe) tablet Take by mouth.   FOSRENOL 500 MG chewable tablet Chew 1,000 mg by mouth 3 (three) times daily.   K Phos Mono-Sod Phos Di & Mono (PHOSPHA 250 NEUTRAL) [phone] MG TABS Take by mouth.   lidocaine-prilocaine (EMLA) cream Apply 1 a small amount to skin as directed as directed Apply to access 1/2hr-1hr prior to treatment, cover with saran wrap   midodrine (PROAMATINE) 10 MG tablet Take 1 tablet by mouth as directed as directed 3x weekly on HD days and one dose 2 hrs into treatment   multivitamin (RENA-VIT) TABS tablet Take by mouth.   Olopatadine HCl 0.2 % SOLN Place 1 drop into the right  eye daily.   RENVELA 800 MG tablet Take 1,600 mg by mouth 3 (three) times daily.   [DISCONTINUED] ofloxacin (OCUFLOX) 0.3 % ophthalmic solution Place 1 drop into the right eye 4 (four) times daily.   [DISCONTINUED] prednisoLONE acetate (PRED FORTE) 1 % ophthalmic suspension Place 1 drop into the right eye 4 (four) times daily.     Allergies:   Patient has no known allergies.   Social History   Socioeconomic History   Marital status: Married    Spouse name: Not on file   Number of children: Not on file   Years of education: Not on file   Highest education level: Not on file   Occupational History   Not on file  Tobacco Use   Smoking status: Never   Smokeless tobacco: Never  Vaping Use   Vaping Use: Never used  Substance and Sexual Activity   Alcohol use: Not Currently   Drug use: Never   Sexual activity: Not Currently  Other Topics Concern   Not on file  Social History Narrative   Not on file   Social Determinants of Health   Financial Resource Strain: Not on file  Food Insecurity: Not on file  Transportation Needs: Not on file  Physical Activity: Not on file  Stress: Not on file  Social Connections: Not on file    Family History: The patient's family history includes Renal Disease in his mother. History of coronary artery disease notable for no members. History of heart failure notable for CHF in mother and may have had ICD.  ROS:   Please see the history of present illness.     All other systems reviewed and are negative.  EKGs/Labs/Other Studies Reviewed:    The following studies were reviewed today:  EKG:   05/15/21: SR 87 WNL  RIGHT/LEFT HEART CATH AND CORONARY ANGIOGRAPHY 03/26/2022  Narrative Angiographically normal coronary arteries. Relatively Normal Right Heart Cath Pressures: RAP mean 5 mmHg, RVP-EDP 20/0-8 mmHg; PAP-mean 23/8-15 mmHg, PCWP 8 mmHg; LVP-EDP 82/0-7 mmHg, AoP-MAP 80/47-61 mmHg; Ao sat 97%, PA sat 72%=> Fick CO-CI 6.93-3.15, TD CO-CI 5.03-2.29 Baseline systemic hypotension with normal mentation-Central SBP reads 7 mmHg less than cuff pressure.   RECOMMENDATIONS Discharge home after bedrest Noncardiac etiology for hypotension  Glenetta Hew, MD   GATED SPECT MYO PERF W/EXERCISE STRESS 1D 05/26/2021  Narrative   The study is normal. The study is low risk.   No ST deviation was noted.   Defect 1: There is a small defect with mild reduction in uptake present in the apex location(s) that is fixed. There is normal wall motion in the defect area. Consistent with artifact.   Nuclear stress EF: 62 %. The  left ventricular ejection fraction is normal (55-65%). Left ventricular function is normal.  Low risk stress nuclear study with normal perfusion and normal left ventricular regional and global systolic function.  ECHO COMPLETE WITH IMAGING ENHANCING AGENT 03/23/2022  Narrative ECHOCARDIOGRAM REPORT    Patient Name:   Bernard Jordan Date of Exam: 03/23/2022 Medical Rec #:  [id number]     Height:       71.0 in Accession #:    [id number]    Weight:       220.0 lb Date of Birth:  06/15/1966      BSA:          2.196 m Patient Age:    61 years      BP:  68/44 mmHg Patient Gender: M             HR:           89 bpm. Exam Location:  [address]  Procedure: 2D Echo, Cardiac Doppler, Color Doppler and Intracardiac Opacification Agent  Indications:    i95.9 Severe hypotension  History:        Patient has no prior history of Echocardiogram examinations. Risk Factors:Hypertension. Anemia. End stage renal disease. Abnormal cardiovascular stress test.  Sonographer:    Diamond Nickel RCS Referring Phys: [place]   1. Left ventricular ejection fraction, by estimation, is 55 to 60%. The left ventricle has normal function. The left ventricle has no regional wall motion abnormalities. There is mild concentric left ventricular hypertrophy. Left ventricular diastolic parameters were normal. 2. Right ventricular systolic function is normal. The right ventricular size is normal. Tricuspid regurgitation signal is inadequate for assessing PA pressure. 3. The mitral valve is grossly normal. Trivial mitral valve regurgitation. No evidence of mitral stenosis. 4. The aortic valve is tricuspid. There is mild calcification of the aortic valve. Aortic valve regurgitation is not visualized. Aortic valve sclerosis is present, with no evidence of aortic valve stenosis. 5. The inferior vena cava is normal in size with greater than 50% respiratory variability, suggesting right atrial  pressure of 3 mmHg.  Comparison(s): No prior Echocardiogram.  Conclusion(s)/Recommendation(s): Normal biventricular function without evidence of hemodynamically significant valvular heart disease. Recent Labs: 03/23/2022: BUN 24; Creatinine, Ser 6.95; Platelets 239 03/26/2022: Hemoglobin 11.2; Potassium 4.3; Sodium 137  Recent Lipid Panel No results found for: "CHOL", "TRIG", "HDL", "CHOLHDL", "VLDL", "LDLCALC", "LDLDIRECT"   Physical Exam:    VS:  BP (!) 72/48   Pulse 87   Ht 5\' 11"  (1.803 m)   Wt 225 lb (102.1 kg)   SpO2 98%   BMI 31.38 kg/m     Wt Readings from Last 3 Encounters:  06/06/22 225 lb (102.1 kg)  03/26/22 220 lb (99.8 kg)  03/16/22 220 lb (99.8 kg)    GEN:  Well nourished, well developed in no acute distress HEENT: Normal NECK: No JVD LYMPHATICS: No lymphadenopathy CARDIAC: RRR, no murmurs, rubs, gallops (Left arm Bruit strong bruit and thrill), no R arm bruit or thrill RESPIRATORY:  Clear to auscultation without rales, wheezing or rhonchi  ABDOMEN: Soft, non-tender, non-distended MUSCULOSKELETAL:  No edema; No deformity  SKIN: Warm and dry NEUROLOGIC:  Alert and oriented x 3 PSYCHIATRIC:  Normal affect   ASSESSMENT:    1. Severe hypotension   2. ESRD (end stage renal disease) ([place])     PLAN:    ESRD seeing [facility] and getting kidney transplant eval at [facility] Non cardiac hypotension - midodrine per nephrology - on no cardiac medications - we will get a bilateral Vascular duplex to look for cardiac causes of hypotension - otherwise, unless he is considered a candidate for renal transplant, we will stop the yearly stress testing and echos  One year with me Copy of note to transplant nephrology    Medication Adjustments/Labs and Tests Ordered: Current medicines are reviewed at length with the patient today.  Concerns regarding medicines are outlined above.  Orders Placed This Encounter  Procedures   VAS [place] UPPER EXTREMITY ARTERIAL DUPLEX     No orders of the defined types were placed in this encounter.    Patient Instructions  Medication Instructions:  Your physician recommends that you continue on your current medications as directed. Please refer to  the Current Medication list given to you today.  *If you need a refill on your cardiac medications before your next appointment, please call your pharmacy*   Lab Work: NONE If you have labs (blood work) drawn today and your tests are completely normal, you will receive your results only by: [place] (if you have MyChart) OR A paper copy in the mail If you have any lab test that is abnormal or we need to change your treatment, we will call you to review the results.   Testing/Procedures: Your physician has requested that you have a Bilateral Upper Extremity Arterial Duplex.    Follow-Up: At [facility], you and your health needs are our priority.  As part of our continuing mission to provide you with exceptional heart care, we have created designated Provider Care Teams.  These Care Teams include your primary Cardiologist (physician) and Advanced Practice Providers (APPs -  Physician Assistants and Nurse Practitioners) who all work together to provide you with the care you need, when you need it.  We recommend signing up for the patient portal called "MyChart".  Sign up information is provided on this After Visit Summary.  MyChart is used to connect with patients for Virtual Visits (Telemedicine).  Patients are able to view lab/test results, encounter notes, upcoming appointments, etc.  Non-urgent messages can be sent to your provider as well.   To learn more about what you can do with MyChart, go to [email or web address].    Your next appointment:   1 year(s)  The format for your next appointment:   In Person  Provider:   Werner Lean, MD     Important Information About Sugar         Signed, Werner Lean, MD   06/06/2022 10:07 AM    [place]

## 2022-06-06 ENCOUNTER — Encounter: Payer: Self-pay | Admitting: Internal Medicine

## 2022-06-06 ENCOUNTER — Ambulatory Visit: Payer: Medicare Other | Attending: Internal Medicine | Admitting: Internal Medicine

## 2022-06-06 VITALS — BP 72/48 | HR 87 | Ht 71.0 in | Wt 225.0 lb

## 2022-06-06 DIAGNOSIS — N186 End stage renal disease: Secondary | ICD-10-CM

## 2022-06-06 DIAGNOSIS — I959 Hypotension, unspecified: Secondary | ICD-10-CM

## 2022-06-06 NOTE — Patient Instructions (Addendum)
Medication Instructions:  Your physician recommends that you continue on your current medications as directed. Please refer to the Current Medication list given to you today.  *If you need a refill on your cardiac medications before your next appointment, please call your pharmacy*   Lab Work: NONE If you have labs (blood work) drawn today and your tests are completely normal, you will receive your results only by: [place] (if you have MyChart) OR A paper copy in the mail If you have any lab test that is abnormal or we need to change your treatment, we will call you to review the results.   Testing/Procedures: Your physician has requested that you have a Bilateral Upper Extremity Arterial Duplex.    Follow-Up: At [facility], you and your health needs are our priority.  As part of our continuing mission to provide you with exceptional heart care, we have created designated Provider Care Teams.  These Care Teams include your primary Cardiologist (physician) and Advanced Practice Providers (APPs -  Physician Assistants and Nurse Practitioners) who all work together to provide you with the care you need, when you need it.  We recommend signing up for the patient portal called "MyChart".  Sign up information is provided on this After Visit Summary.  MyChart is used to connect with patients for Virtual Visits (Telemedicine).  Patients are able to view lab/test results, encounter notes, upcoming appointments, etc.  Non-urgent messages can be sent to your provider as well.   To learn more about what you can do with MyChart, go to [email or web address].    Your next appointment:   1 year(s)  The format for your next appointment:   In Person  Provider:   Werner Lean, MD     Important Information About Sugar

## 2022-06-13 ENCOUNTER — Other Ambulatory Visit: Payer: Self-pay | Admitting: Internal Medicine

## 2022-06-13 DIAGNOSIS — N186 End stage renal disease: Secondary | ICD-10-CM

## 2022-06-13 DIAGNOSIS — I959 Hypotension, unspecified: Secondary | ICD-10-CM

## 2022-06-15 ENCOUNTER — Ambulatory Visit (HOSPITAL_COMMUNITY)
Admission: RE | Admit: 2022-06-15 | Discharge: 2022-06-15 | Disposition: A | Discharge status: Home / Self Care | Payer: Medicare Other | Source: Ambulatory Visit | Attending: Internal Medicine | Admitting: Internal Medicine

## 2022-06-15 DIAGNOSIS — N186 End stage renal disease: Secondary | ICD-10-CM | POA: Diagnosis present

## 2022-06-15 DIAGNOSIS — I959 Hypotension, unspecified: Secondary | ICD-10-CM | POA: Insufficient documentation

## 2024-02-27 ENCOUNTER — Encounter: Payer: Self-pay | Admitting: Gastroenterology

## 2024-03-04 ENCOUNTER — Telehealth: Payer: Self-pay

## 2024-03-04 NOTE — Telephone Encounter (Signed)
 Hello Dr. Karene Oto, This patient was referred to you and is on your schedule for a colonoscopy at [facility] on 04/08/24.  However, he is ESRD, on dialysis, and awaiting kidney transplant so he would need to be done at the hospital.  Would you like to see him in the office first and if so, can he see any provider/mid-level?  I have canceled his procedure at [facility]. Thank you Sherisa Gilvin

## 2024-03-05 NOTE — Telephone Encounter (Signed)
 Patient aware that he is scheduled for new patient consult with Deanna May, NP on 03-09-24 at 1100a.  Patient advised to arrive at 1045a for registration.  Patient agreed to plan and verbalized understanding.  No further questions.

## 2024-03-09 ENCOUNTER — Encounter: Payer: Self-pay | Admitting: Gastroenterology

## 2024-03-09 ENCOUNTER — Ambulatory Visit (INDEPENDENT_AMBULATORY_CARE_PROVIDER_SITE_OTHER): Admitting: Gastroenterology

## 2024-03-09 VITALS — BP 96/54 | HR 89 | Ht 71.0 in | Wt 224.1 lb

## 2024-03-09 DIAGNOSIS — Z1211 Encounter for screening for malignant neoplasm of colon: Secondary | ICD-10-CM

## 2024-03-09 MED ORDER — NA SULFATE-K SULFATE-MG SULF 17.5-3.13-1.6 GM/177ML PO SOLN: 1.0000 | Freq: Once | ORAL | 0 refills | Status: AC

## 2024-03-09 NOTE — Progress Notes (Signed)
 Chief Complaint:colonoscopy Primary GI Doctor:Dr. Karene Oto  HPI:  Patient is a  58  year old male patient with past medical history of ESRD, hypertension,who was referred to me by Berneda Bridges, MD on 02/10/24 for colonoscopy. .    Interval History    Patient presents for evaluation of colonoscopy. He has history of ESRD and on dialysis (Tuesday/Thursday/Saturday) , and awaiting kidney transplant.  Patient denies GERD or dysphagia.Patient denies nausea, vomiting, or weight loss  Patient denies altered bowel habits, abdominal pain, rectal bleeding  No blood thinners.   Never had EGD or colon.  No alcohol use. Nonsmoker.   No family history of colon CA or GI issues   Wt Readings from Last 3 Encounters:  03/09/24 224 lb 2 oz (101.7 kg)  06/06/22 225 lb (102.1 kg)  03/26/22 220 lb (99.8 kg)   Past Medical History:  Diagnosis Date   AKI (acute kidney injury) ([facility]) 05/08/2019   CKD (chronic kidney disease), stage IV ([facility]) 05/08/2019   Polycystic kidney disease    Past Surgical History:  Procedure Laterality Date   BASCILIC VEIN TRANSPOSITION Left 05/14/2019   Procedure: BASILIC VEIN TRANSPOSITION;  Surgeon: Mayo Speck, MD;  [facility];  Service: Vascular;  Laterality: Left;   BASCILIC VEIN TRANSPOSITION Left 07/06/2019   Procedure: SECOND STAGE BASILIC VEIN TRANSPOSITION LEFT ARM;  Surgeon: Mayo Speck, MD;  [facility];  Service: Vascular;  Laterality: Left;   INSERTION OF DIALYSIS CATHETER Right 05/14/2019   Procedure: INSERTION OF TUNNELED DIALYSIS CATHETER;  Surgeon: Mayo Speck, MD;  [facility];  Service: Vascular;  Laterality: Right;   RIGHT/LEFT HEART CATH AND CORONARY ANGIOGRAPHY N/A 03/26/2022   Procedure: RIGHT/LEFT HEART CATH AND CORONARY ANGIOGRAPHY;  Surgeon: Arleen Lacer, MD;  Location: [facility] INVASIVE CV LAB;  Service: Cardiovascular;  Laterality: N/A;   shoulder surgery rotator cuff Left     Current Outpatient Medications  Medication Sig  Dispense Refill   acetaminophen  (TYLENOL ) 500 MG tablet Take by mouth.     azaTHIOprine (IMURAN) 50 MG tablet Take 100 mg by mouth daily.     b complex-vitamin c-folic acid  (NEPHRO-VITE) 0.8 MG TABS tablet Take by mouth.     brimonidine (ALPHAGAN) 0.2 % ophthalmic solution 1 drop daily.     calcitRIOL  (ROCALTROL ) 0.5 MCG capsule Take by mouth daily.     Cyanocobalamin (B-12 DOTS) 500 MCG TBDP Take by mouth daily.     dorzolamide-timolol (COSOPT) 22.3-6.8 MG/ML ophthalmic solution Place 1 drop into the right eye 3 (three) times daily.     ferric citrate (AURYXIA) 1 GM 210 MG(Fe) tablet Take by mouth.     FOSRENOL 500 MG chewable tablet Chew 1,000 mg by mouth 3 (three) times daily.     K Phos Mono-Sod Phos Di & Mono (PHOSPHA 250 NEUTRAL) [phone] MG TABS Take by mouth.     lidocaine -prilocaine  (EMLA ) cream Apply 1 a small amount to skin as directed as directed Apply to access 1/2hr-1hr prior to treatment, cover with saran wrap     midodrine  (PROAMATINE ) 10 MG tablet Take 1 tablet by mouth as directed as directed 3x weekly on HD days and one dose 2 hrs into treatment 90 tablet 3   multivitamin (RENA-VIT) TABS tablet Take by mouth.     Olopatadine HCl 0.2 % SOLN Place 1 drop into the right eye daily.     RENVELA 800 MG tablet Take 1,600 mg by mouth 3 (three) times daily.  No current facility-administered medications for this visit.    Allergies as of 03/09/2024   (No Known Allergies)    Family History  Problem Relation Age of Onset   Renal Disease Mother        gets iHD    Review of Systems:    Constitutional: No weight loss, fever, chills, weakness or fatigue HEENT: Eyes: No change in vision               Ears, Nose, Throat:  No change in hearing or congestion Skin: No rash or itching Cardiovascular: No chest pain, chest pressure or palpitations   Respiratory: No SOB or cough Gastrointestinal: See HPI and otherwise negative Genitourinary: No dysuria or change in urinary  frequency Neurological: No headache, dizziness or syncope Musculoskeletal: No new muscle or joint pain Hematologic: No bleeding or bruising Psychiatric: No history of depression or anxiety    Physical Exam:  Vital signs: BP (!) 96/54 (BP Location: Right Arm, Patient Position: Sitting, Cuff Size: Normal)   Pulse 89   Ht 5\' 11"  (1.803 m)   Wt 224 lb 2 oz (101.7 kg)   SpO2 94%   BMI 31.26 kg/m   Constitutional:   Pleasant  A.A. male appears to be in NAD, Well developed, Well nourished, alert and cooperative Throat: Oral cavity and pharynx without inflammation, swelling or lesion.  Respiratory: Respirations even and unlabored. Lungs clear to auscultation bilaterally.   No wheezes, crackles, or rhonchi.  Cardiovascular: Normal S1, S2. Regular rate and rhythm. No peripheral edema, cyanosis or pallor.  Gastrointestinal:  Soft, nondistended, nontender. No rebound or guarding. Normal bowel sounds. No appreciable masses or hepatomegaly. Rectal:  Not performed.  Msk:  Symmetrical without gross deformities. Without edema, no deformity or joint abnormality.  Neurologic:  Alert and  oriented x4;  grossly normal neurologically.  Skin:   Dry and intact without significant lesions or rashes. Psychiatric: Oriented to person, place and time. Demonstrates good judgement and reason without abnormal affect or behaviors.  RELEVANT LABS AND IMAGING: CBC    Latest Ref Rng & Units 03/26/2022    8:16 AM 03/26/2022    8:15 AM 03/26/2022    7:57 AM  CBC  Hemoglobin 13.0 - 17.0 g/dL 16.1  09.6  04.5   Hematocrit 39.0 - 52.0 % 33.0  33.0  33.0      CMP     Latest Ref Rng & Units 03/26/2022    8:16 AM 03/26/2022    8:15 AM 03/26/2022    7:57 AM  CMP  Sodium 135 - 145 mmol/L 137  137  136   Potassium 3.5 - 5.1 mmol/L 4.3  4.3  4.3    03/11/23 labs show: hgb 12.8, plt 217, BUN 52, Creat 9.96 03/23/22 echo- Left ventricular ejection fraction, by estimation, is 55 to 60%.   Assessment: Encounter Diagnosis   Name Primary?   Special screening for malignant neoplasms, colon Yes     58 year old A. A male patient here to scheduled colon cancer screening. History of ESRD with dialysis so will schedule with Dr. Maple Seltzer at the hospital. No current GI issues to report.  Plan: -  Schedule for a colonoscopy in hospital with Dr. Karene Oto. The risks and benefits of colonoscopy with possible polypectomy / biopsies were discussed and the patient agrees to proceed.    Thank you for the courtesy of this consult. Please call me with any questions or concerns.   San Lohmeyer, FNP-C [facility] 03/09/2024, 11:39 AM  Cc: Berneda Bridges, MD

## 2024-03-09 NOTE — Patient Instructions (Signed)
 You have been scheduled for a colonoscopy. Please follow written instructions given to you at your visit today.   If you use inhalers (even only as needed), please bring them with you on the day of your procedure.  DO NOT TAKE 7 DAYS PRIOR TO TEST- Trulicity (dulaglutide) Ozempic, Wegovy (semaglutide) Mounjaro (tirzepatide) Bydureon Bcise (exanatide extended release)  DO NOT TAKE 1 DAY PRIOR TO YOUR TEST Rybelsus (semaglutide) Adlyxin (lixisenatide) Victoza (liraglutide) Byetta (exanatide) ___________________________________________________________________________  Due to recent changes in healthcare laws, you may see the results of your imaging and laboratory studies on MyChart before your provider has had a chance to review them.  We understand that in some cases there may be results that are confusing or concerning to you. Not all laboratory results come back in the same time frame and the provider may be waiting for multiple results in order to interpret others.  Please give us  48 hours in order for your provider to thoroughly review all the results before contacting the office for clarification of your results.    _______________________________________________________  If your blood pressure at your visit was 140/90 or greater, please contact your primary care physician to follow up on this.  _______________________________________________________  If you are age 27 or older, your body mass index should be between 23-30. Your Body mass index is 31.26 kg/m. If this is out of the aforementioned range listed, please consider follow up with your Primary Care Provider.  If you are age 86 or younger, your body mass index should be between 19-25. Your Body mass index is 31.26 kg/m. If this is out of the aformentioned range listed, please consider follow up with your Primary Care Provider.   ________________________________________________________  The [place] [facility] providers would like  to encourage you to use MYCHART to communicate with providers for non-urgent requests or questions.  Due to long hold times on the telephone, sending your provider a message by [facility] may be a faster and more efficient way to get a response.  Please allow 48 business hours for a response.  Please remember that this is for non-urgent requests.  _______________________________________________________   Thank you for trusting me with your gastrointestinal care. Deanna May, RNP

## 2024-03-16 ENCOUNTER — Encounter

## 2024-04-08 ENCOUNTER — Encounter: Admitting: Gastroenterology

## 2024-04-27 ENCOUNTER — Encounter (HOSPITAL_COMMUNITY): Payer: Self-pay | Admitting: Gastroenterology

## 2024-04-27 NOTE — Progress Notes (Signed)
 Attempted to obtain medical history for pre op call via telephone, unable to reach at this time. HIPAA compliant voicemail message left requesting return call to pre surgical testing department.

## 2024-04-29 ENCOUNTER — Telehealth: Payer: Self-pay

## 2024-04-29 NOTE — Telephone Encounter (Signed)
 Procedure:Colon Procedure date: 05/04/24 Procedure location: [facility] Arrival Time: 8:45 am Spoke with the patient Y/N:  Called pt at 2 pm no answer left detail VM to call back to confirm procedure.. Any prep concerns? .  Has the patient obtained the prep from the pharmacy ? SABRA Do you have a care partner and transportation: . Any additional concerns? SABRA

## 2024-05-01 NOTE — Telephone Encounter (Signed)
 Confirmed procedure with patient

## 2024-05-04 ENCOUNTER — Ambulatory Visit (HOSPITAL_COMMUNITY): Admitting: Certified Registered"

## 2024-05-04 ENCOUNTER — Encounter (HOSPITAL_COMMUNITY): Payer: Self-pay | Admitting: Gastroenterology

## 2024-05-04 ENCOUNTER — Ambulatory Visit (HOSPITAL_BASED_OUTPATIENT_CLINIC_OR_DEPARTMENT_OTHER): Admitting: Certified Registered"

## 2024-05-04 ENCOUNTER — Encounter (HOSPITAL_COMMUNITY)
Admission: RE | Disposition: A | Discharge status: Home / Self Care | Payer: Self-pay | Source: Home / Self Care | Attending: Gastroenterology

## 2024-05-04 ENCOUNTER — Other Ambulatory Visit: Payer: Self-pay

## 2024-05-04 ENCOUNTER — Ambulatory Visit (HOSPITAL_COMMUNITY)
Admission: RE | Admit: 2024-05-04 | Discharge: 2024-05-04 | Disposition: A | Discharge status: Home / Self Care | Attending: Gastroenterology | Admitting: Gastroenterology

## 2024-05-04 DIAGNOSIS — I120 Hypertensive chronic kidney disease with stage 5 chronic kidney disease or end stage renal disease: Secondary | ICD-10-CM | POA: Diagnosis not present

## 2024-05-04 DIAGNOSIS — D124 Benign neoplasm of descending colon: Secondary | ICD-10-CM | POA: Insufficient documentation

## 2024-05-04 DIAGNOSIS — N186 End stage renal disease: Secondary | ICD-10-CM | POA: Insufficient documentation

## 2024-05-04 DIAGNOSIS — Z992 Dependence on renal dialysis: Secondary | ICD-10-CM | POA: Diagnosis not present

## 2024-05-04 DIAGNOSIS — Z7682 Awaiting organ transplant status: Secondary | ICD-10-CM | POA: Diagnosis not present

## 2024-05-04 DIAGNOSIS — Z1211 Encounter for screening for malignant neoplasm of colon: Secondary | ICD-10-CM | POA: Diagnosis present

## 2024-05-04 DIAGNOSIS — K5730 Diverticulosis of large intestine without perforation or abscess without bleeding: Secondary | ICD-10-CM | POA: Diagnosis not present

## 2024-05-04 DIAGNOSIS — D122 Benign neoplasm of ascending colon: Secondary | ICD-10-CM | POA: Insufficient documentation

## 2024-05-04 HISTORY — PX: COLONOSCOPY: SHX5424

## 2024-05-04 LAB — POCT I-STAT, CHEM 8
BUN: 33 mg/dL — ABNORMAL HIGH (ref 6–20)
Calcium, Ion: 1.03 mmol/L — ABNORMAL LOW (ref 1.15–1.40)
Chloride: 95 mmol/L — ABNORMAL LOW (ref 98–111)
Creatinine, Ser: 9.1 mg/dL — ABNORMAL HIGH (ref 0.61–1.24)
Glucose, Bld: 91 mg/dL (ref 70–99)
HCT: 38 % — ABNORMAL LOW (ref 39.0–52.0)
Hemoglobin: 12.9 g/dL — ABNORMAL LOW (ref 13.0–17.0)
Potassium: 3.8 mmol/L (ref 3.5–5.1)
Sodium: 140 mmol/L (ref 135–145)
TCO2: 31 mmol/L (ref 22–32)

## 2024-05-04 SURGERY — COLONOSCOPY
Anesthesia: Monitor Anesthesia Care

## 2024-05-04 MED ORDER — LIDOCAINE 2% (20 MG/ML) 5 ML SYRINGE
INTRAMUSCULAR | Status: DC | PRN
  Administered 2024-05-04: 40 mg via INTRAVENOUS

## 2024-05-04 MED ORDER — PROPOFOL 1000 MG/100ML IV EMUL
INTRAVENOUS | Status: AC
  Filled 2024-05-04: qty 100

## 2024-05-04 MED ORDER — PROPOFOL 500 MG/50ML IV EMUL
INTRAVENOUS | Status: AC
  Filled 2024-05-04: qty 50

## 2024-05-04 MED ORDER — PROPOFOL 500 MG/50ML IV EMUL
INTRAVENOUS | Status: DC | PRN
  Administered 2024-05-04: 125 ug/kg/min via INTRAVENOUS

## 2024-05-04 MED ORDER — PROPOFOL 10 MG/ML IV BOLUS
INTRAVENOUS | Status: DC | PRN
  Administered 2024-05-04 (×2): 20 mg via INTRAVENOUS

## 2024-05-04 MED ORDER — SODIUM CHLORIDE 0.9 % IV SOLN: INTRAVENOUS | Status: DC | PRN

## 2024-05-04 MED ORDER — PHENYLEPHRINE 80 MCG/ML (10ML) SYRINGE FOR IV PUSH (FOR BLOOD PRESSURE SUPPORT)
PREFILLED_SYRINGE | INTRAVENOUS | Status: DC | PRN
  Administered 2024-05-04 (×5): 80 ug via INTRAVENOUS
  Administered 2024-05-04: 160 ug via INTRAVENOUS
  Administered 2024-05-04 (×7): 80 ug via INTRAVENOUS

## 2024-05-04 NOTE — Op Note (Signed)
 [facility] Patient Name: Bernard Jordan Procedure Date: 05/04/2024 MRN: [id number] Attending MD: Sandor Flatter , MD, [id number] Date of Birth: [birth year] CSN: [id number] Age: 58 Admit Type: Outpatient Procedure:                Colonoscopy Indications:              Screening for colorectal malignant neoplasm, This                            is the patient's first colonoscopy Providers:                Sandor Flatter, MD, Robie Breed, RN,                            Coye Bade, Technician Referring MD:              Medicines:                Monitored Anesthesia Care Complications:            No immediate complications. Estimated Blood Loss:     Estimated blood loss was minimal. Procedure:                Pre-Anesthesia Assessment:                           - Prior to the procedure, a History and Physical                            was performed, and patient medications and                            allergies were reviewed. The patient's tolerance of                            previous anesthesia was also reviewed. The risks                            and benefits of the procedure and the sedation                            options and risks were discussed with the patient.                            All questions were answered, and informed consent                            was obtained. Prior Anticoagulants: The patient has                            taken no anticoagulant or antiplatelet agents. ASA                            Grade Assessment: III - A patient with severe  systemic disease. After reviewing the risks and                            benefits, the patient was deemed in satisfactory                            condition to undergo the procedure.                           After obtaining informed consent, the colonoscope                            was passed under direct vision. Throughout the                             procedure, the patient's blood pressure, pulse, and                            oxygen saturations were monitored continuously. The                            CF-HQ190L ([id number]) Olympus colonoscope was                            introduced through the anus and advanced to the the                            cecum, identified by appendiceal orifice and                            ileocecal valve. The colonoscopy was performed                            without difficulty. The patient tolerated the                            procedure well. The quality of the bowel                            preparation was good. The ileocecal valve,                            appendiceal orifice, and rectum were photographed. Scope In: 10:05:39 AM Scope Out: 10:33:44 AM Scope Withdrawal Time: 0 hours 19 minutes 14 seconds  Total Procedure Duration: 0 hours 28 minutes 5 seconds  Findings:      The perianal and digital rectal examinations were normal.      A 5 mm polyp was found in the ascending colon. The polyp was sessile.       The polyp was removed with a cold snare. Resection and retrieval were       complete. Estimated blood loss was minimal.      Two sessile polyps were found in the descending colon. The polyps were 3       to 4 mm in size. These polyps were removed with a  cold snare. Resection       and retrieval were complete. Estimated blood loss was minimal.      A few small-mouthed diverticula were found in the sigmoid colon.      The retroflexed view of the distal rectum and anal verge was normal and       showed no anal or rectal abnormalities. Impression:               - One 5 mm polyp in the ascending colon, removed                            with a cold snare. Resected and retrieved.                           - Two 3 to 4 mm polyps in the descending colon,                            removed with a cold snare. Resected and retrieved.                           - Diverticulosis in the sigmoid  colon.                           - The distal rectum and anal verge are normal on                            retroflexion view. Moderate Sedation:      Not Applicable - Patient had care per Anesthesia. Recommendation:           - Patient has a contact number available for                            emergencies. The signs and symptoms of potential                            delayed complications were discussed with the                            patient. Return to normal activities tomorrow.                            Written discharge instructions were provided to the                            patient.                           - Resume previous diet.                           - Continue present medications.                           - Await pathology results.                           -  Repeat colonoscopy for surveillance based on                            pathology results.                           - Return to GI office PRN. Procedure Code(s):        --- Professional ---                           [phone], Colonoscopy, flexible; with removal of                            tumor(s), polyp(s), or other lesion(s) by snare                            technique Diagnosis Code(s):        --- Professional ---                           Z12.11, Encounter for screening for malignant                            neoplasm of colon                           D12.2, Benign neoplasm of ascending colon                           D12.4, Benign neoplasm of descending colon                           K57.30, Diverticulosis of large intestine without                            perforation or abscess without bleeding CPT copyright 2022 [employer]. All rights reserved. The codes documented in this report are preliminary and upon coder review may  be revised to meet current compliance requirements. Sandor Flatter, MD 05/04/2024 10:38:29 AM Number of Addenda: 0

## 2024-05-04 NOTE — Discharge Instructions (Addendum)
 YOU HAD AN ENDOSCOPIC PROCEDURE TODAY: Refer to the procedure report and other information in the discharge instructions given to you for any specific questions about what was found during the examination. If this information does not answer your questions, please call [place] office at [phone] to clarify.   YOU SHOULD EXPECT: Some feelings of bloating in the abdomen. Passage of more gas than usual. Walking can help get rid of the air that was put into your GI tract during the procedure and reduce the bloating. If you had a lower endoscopy (such as a colonoscopy or flexible sigmoidoscopy) you may notice spotting of blood in your stool or on the toilet paper. Some abdominal soreness may be present for a day or two, also.  DIET: Your first meal following the procedure should be a light meal and then it is ok to progress to your normal diet. A half-sandwich or bowl of soup is an example of a good first meal. Heavy or fried foods are harder to digest and may make you feel nauseous or bloated. Drink plenty of fluids but you should avoid alcoholic beverages for 24 hours. If you had a esophageal dilation, please see attached instructions for diet.    ACTIVITY: Your care partner should take you home directly after the procedure. You should plan to take it easy, moving slowly for the rest of the day. You can resume normal activity the day after the procedure however YOU SHOULD NOT DRIVE, use power tools, machinery or perform tasks that involve climbing or major physical exertion for 24 hours (because of the sedation medicines used during the test).   SYMPTOMS TO REPORT IMMEDIATELY: A gastroenterologist can be reached at any hour. Please call [phone]  for any of the following symptoms:  Following lower endoscopy (colonoscopy, flexible sigmoidoscopy) Excessive amounts of blood in the stool  Significant tenderness, worsening of abdominal pains  Swelling of the abdomen that is new, acute  Fever of 100 or  higher   FOLLOW UP:  If any biopsies were taken you will be contacted by phone or by letter within the next 1-3 weeks. Call [phone]  if you have not heard about the biopsies in 3 weeks.  Please also call with any specific questions about appointments or follow up tests.

## 2024-05-04 NOTE — H&P (Signed)
 GASTROENTEROLOGY PROCEDURE H&P NOTE   Primary Care Physician: Alec House, MD    Reason for Procedure:  Colon cancer screening  Plan:    Colonoscopy   Patient is appropriate for endoscopic procedure(s) at [facility] Endoscopy Unit.  The nature of the procedure, as well as the risks, benefits, and alternatives were carefully and thoroughly reviewed with the patient. Ample time for discussion and questions allowed. The patient understood, was satisfied, and agreed to proceed.     HPI: Bernard Jordan is a 58 y.o. male who presents for colonoscopy for CRC screening.  No previous colonoscopy.  No GI symptoms.  Procedure scheduled at [facility] Endoscopy unit due to elevated periprocedural risks from underlying comorbidities, to include ESRD on HD, awaiting transplant listing.  Past Medical History:  Diagnosis Date   AKI (acute kidney injury) (HCC) 05/08/2019   CKD (chronic kidney disease), stage IV (HCC) 05/08/2019   Polycystic kidney disease     Past Surgical History:  Procedure Laterality Date   BASCILIC VEIN TRANSPOSITION Left 05/14/2019   Procedure: BASILIC VEIN TRANSPOSITION;  Surgeon: Oris Krystal FALCON, MD;  Location: [facility] OR;  Service: Vascular;  Laterality: Left;   BASCILIC VEIN TRANSPOSITION Left 07/06/2019   Procedure: SECOND STAGE BASILIC VEIN TRANSPOSITION LEFT ARM;  Surgeon: Oris Krystal FALCON, MD;  Location: [facility] OR;  Service: Vascular;  Laterality: Left;   INSERTION OF DIALYSIS CATHETER Right 05/14/2019   Procedure: INSERTION OF TUNNELED DIALYSIS CATHETER;  Surgeon: Oris Krystal FALCON, MD;  Location: [facility] OR;  Service: Vascular;  Laterality: Right;   RIGHT/LEFT HEART CATH AND CORONARY ANGIOGRAPHY N/A 03/26/2022   Procedure: RIGHT/LEFT HEART CATH AND CORONARY ANGIOGRAPHY;  Surgeon: Anner Alm ORN, MD;  Location: [facility] INVASIVE CV LAB;  Service: Cardiovascular;  Laterality: N/A;   shoulder surgery rotator cuff Left     Prior to Admission medications   Medication Sig Start  Date End Date Taking? Authorizing Provider  b complex-vitamin c-folic acid  (NEPHRO-VITE) 0.8 MG TABS tablet Take by mouth. 11/23/16  Yes [provider]  brimonidine (ALPHAGAN) 0.2 % ophthalmic solution 1 drop daily. 04/27/21  Yes [provider]  calcitRIOL  (ROCALTROL ) 0.5 MCG capsule Take by mouth daily. 05/26/19  Yes [provider]  Cyanocobalamin (B-12 DOTS) 500 MCG TBDP Take by mouth daily. 05/26/19  Yes [provider]  dorzolamide-timolol (COSOPT) 22.3-6.8 MG/ML ophthalmic solution Place 1 drop into the right eye 3 (three) times daily. 03/10/21  Yes [provider]  FOSRENOL 500 MG chewable tablet Chew 1,000 mg by mouth 3 (three) times daily. 10/03/21  Yes [provider]  K Phos  Mono-Sod Phos Di & Mono (PHOSPHA 250 NEUTRAL) 155-852-130 MG TABS Take by mouth. 05/26/19  Yes [provider]  midodrine  (PROAMATINE ) 10 MG tablet Take 1 tablet by mouth as directed as directed 3x weekly on HD days and one dose 2 hrs into treatment 03/26/22  Yes Anner Alm ORN, MD  Olopatadine HCl 0.2 % SOLN Place 1 drop into the right eye daily. 02/15/21  Yes [provider]  acetaminophen  (TYLENOL ) 500 MG tablet Take by mouth. 05/26/19   [provider]  azaTHIOprine (IMURAN) 50 MG tablet Take 100 mg by mouth daily. 01/30/22   [provider]  ferric citrate (AURYXIA) 1 GM 210 MG(Fe) tablet Take by mouth. 08/04/19   [provider]  lidocaine -prilocaine  (EMLA ) cream Apply 1 a small amount to skin as directed as directed Apply to access 1/2hr-1hr prior to treatment, cover with saran  wrap 08/04/19   [provider]  multivitamin (RENA-VIT) TABS tablet Take by mouth. 06/30/19   [provider]  RENVELA 800 MG tablet Take 1,600 mg by mouth 3 (three) times daily. 11/14/21   [provider]    No current facility-administered medications for this encounter.    Allergies as of 03/09/2024   (No Known  Allergies)    Family History  Problem Relation Age of Onset   Renal Disease Mother        gets iHD    Social History   Socioeconomic History   Marital status: Married    Spouse name: Not on file   Number of children: Not on file   Years of education: Not on file   Highest education level: Not on file  Occupational History   Not on file  Tobacco Use   Smoking status: Never   Smokeless tobacco: Never  Vaping Use   Vaping status: Never Used  Substance and Sexual Activity   Alcohol use: Not Currently   Drug use: Never   Sexual activity: Not Currently  Other Topics Concern   Not on file  Social History Narrative   Not on file   Social Drivers of Health   Financial Resource Strain: Not on file  Food Insecurity: Not on file  Transportation Needs: Not on file  Physical Activity: Not on file  Stress: Not on file  Social Connections: Not on file  Intimate Partner Violence: Not on file    Physical Exam: Vital signs in last 24 hours: @BP  (!) 92/57   Pulse 76   Temp 97.8 F (36.6 C) (Temporal)   Resp 19   Ht 5' 11 (1.803 m)   Wt 102.1 kg   SpO2 96%   BMI 31.38 kg/m  GEN: NAD EYE: Sclerae anicteric ENT: MMM CV: Non-tachycardic Pulm: CTA b/l GI: Soft, NT/ND NEURO:  Alert & Oriented x 3   Sandor Flatter, DO [facility]   05/04/2024 8:54 AM

## 2024-05-04 NOTE — Interval H&P Note (Signed)
 History and Physical Interval Note:  05/04/2024 8:55 AM  Bernard Jordan  has presented today for surgery, with the diagnosis of Colon Screening Z12.11.  The various methods of treatment have been discussed with the patient and family. After consideration of risks, benefits and other options for treatment, the patient has consented to  Procedure(s): COLONOSCOPY (N/A) as a surgical intervention.  The patient's history has been reviewed, patient examined, no change in status, stable for surgery.  I have reviewed the patient's chart and labs.  Questions were answered to the patient's satisfaction.     Sandor GAILS Ysmael Hires

## 2024-05-04 NOTE — Anesthesia Preprocedure Evaluation (Addendum)
 Anesthesia Evaluation  Patient identified by MRN, date of birth, ID band Patient awake    Reviewed: Allergy & Precautions, NPO status , Patient's Chart, lab work & pertinent test results  Airway Mallampati: II  TM Distance: >3 FB Neck ROM: Full    Dental  (+) Poor Dentition, Missing, Chipped   Pulmonary neg pulmonary ROS   Pulmonary exam normal        Cardiovascular hypertension, Normal cardiovascular exam     Neuro/Psych negative neurological ROS  negative psych ROS   GI/Hepatic negative GI ROS, Neg liver ROS,,,  Endo/Other  negative endocrine ROS    Renal/GU ESRF and DialysisRenal disease     Musculoskeletal   Abdominal  (+) + obese  Peds  Hematology   Anesthesia Other Findings Colon Screening  Reproductive/Obstetrics                              Anesthesia Physical Anesthesia Plan  ASA: 3  Anesthesia Plan: MAC   Post-op Pain Management:    Induction:   PONV Risk Score and Plan: 1 and Propofol  infusion and Treatment may vary due to age or medical condition  Airway Management Planned: Nasal Cannula  Additional Equipment:   Intra-op Plan:   Post-operative Plan:   Informed Consent: I have reviewed the patients History and Physical, chart, labs and discussed the procedure including the risks, benefits and alternatives for the proposed anesthesia with the patient or authorized representative who has indicated his/her understanding and acceptance.     Dental advisory given  Plan Discussed with: CRNA  Anesthesia Plan Comments:         Anesthesia Quick Evaluation

## 2024-05-04 NOTE — Transfer of Care (Signed)
 Immediate Anesthesia Transfer of Care Note  Patient: Bernard Jordan  Procedure(s) Performed: COLONOSCOPY  Patient Location: PACU and Endoscopy Unit  Anesthesia Type:MAC  Level of Consciousness: awake, alert , and patient cooperative  Airway & Oxygen Therapy: Patient Spontanous Breathing and Patient connected to face mask oxygen  Post-op Assessment: Report given to RN and Post -op Vital signs reviewed and stable  Post vital signs: Reviewed and stable  Last Vitals:  Vitals Value Taken Time  BP 95/44 05/04/24 10:45  Temp    Pulse 62 05/04/24 10:45  Resp 22 05/04/24 10:45  SpO2 99 % 05/04/24 10:45  Vitals shown include unfiled device data.  Last Pain:  Vitals:   05/04/24 0852  TempSrc: Temporal  PainSc: 0-No pain         Complications: No notable events documented.

## 2024-05-05 LAB — SURGICAL PATHOLOGY

## 2024-05-05 NOTE — Anesthesia Postprocedure Evaluation (Signed)
 Anesthesia Post Note  Patient: Bernard Jordan  Procedure(s) Performed: COLONOSCOPY     Patient location during evaluation: Endoscopy Anesthesia Type: MAC Level of consciousness: awake Pain management: pain level controlled Vital Signs Assessment: post-procedure vital signs reviewed and stable Respiratory status: spontaneous breathing, nonlabored ventilation and respiratory function stable Cardiovascular status: blood pressure returned to baseline and stable Postop Assessment: no apparent nausea or vomiting Anesthetic complications: no   No notable events documented.  Last Vitals:  Vitals:   05/04/24 1050 05/04/24 1100  BP: (!) 84/38 (!) 82/36  Pulse: 72 (!) 59  Resp: 20 16  Temp:    SpO2: 100% 100%    Last Pain:  Vitals:   05/04/24 1100  TempSrc:   PainSc: 0-No pain                 Caliber Landess P Tal Neer

## 2024-05-06 ENCOUNTER — Encounter (HOSPITAL_COMMUNITY): Payer: Self-pay | Admitting: Gastroenterology

## 2024-05-06 ENCOUNTER — Ambulatory Visit: Payer: Self-pay | Admitting: Gastroenterology
